# Patient Record
Sex: Male | Born: 1950 | Race: White | Hispanic: No | Marital: Single | Smoking: Never smoker
Health system: Southern US, Community
[De-identification: ages and names within clinical notes are randomized; demographics above are authoritative.]

## PROBLEM LIST (undated history)

## (undated) DIAGNOSIS — F439 Reaction to severe stress, unspecified: Secondary | ICD-10-CM

## (undated) DIAGNOSIS — E7800 Pure hypercholesterolemia, unspecified: Secondary | ICD-10-CM

## (undated) DIAGNOSIS — E88819 Insulin resistance, unspecified: Secondary | ICD-10-CM

## (undated) DIAGNOSIS — R51 Headache: Secondary | ICD-10-CM

## (undated) DIAGNOSIS — E559 Vitamin D deficiency, unspecified: Secondary | ICD-10-CM

## (undated) DIAGNOSIS — E669 Obesity, unspecified: Secondary | ICD-10-CM

## (undated) DIAGNOSIS — I10 Essential (primary) hypertension: Secondary | ICD-10-CM

## (undated) DIAGNOSIS — F32A Depression, unspecified: Secondary | ICD-10-CM

## (undated) DIAGNOSIS — F329 Major depressive disorder, single episode, unspecified: Secondary | ICD-10-CM

## (undated) DIAGNOSIS — K589 Irritable bowel syndrome without diarrhea: Secondary | ICD-10-CM

## (undated) DIAGNOSIS — E8881 Metabolic syndrome: Secondary | ICD-10-CM

## (undated) HISTORY — DX: Reaction to severe stress, unspecified: F43.9

## (undated) HISTORY — DX: Metabolic syndrome: E88.81

## (undated) HISTORY — DX: Obesity, unspecified: E66.9

## (undated) HISTORY — DX: Insulin resistance, unspecified: E88.819

## (undated) HISTORY — DX: Essential (primary) hypertension: I10

## (undated) HISTORY — DX: Pure hypercholesterolemia, unspecified: E78.00

## (undated) HISTORY — DX: Vitamin D deficiency, unspecified: E55.9

## (undated) HISTORY — DX: Irritable bowel syndrome without diarrhea: K58.9

## (undated) HISTORY — DX: Headache: R51

## (undated) HISTORY — DX: Irritable bowel syndrome, unspecified: K58.9

## (undated) HISTORY — PX: ABDOMINAL SURGERY: SHX537

---

## 2003-01-21 ENCOUNTER — Emergency Department (HOSPITAL_COMMUNITY): Admission: EM | Admit: 2003-01-21 | Discharge: 2003-01-21 | Payer: Self-pay | Admitting: Emergency Medicine

## 2003-06-27 ENCOUNTER — Emergency Department (HOSPITAL_COMMUNITY): Admission: EM | Admit: 2003-06-27 | Discharge: 2003-06-27 | Payer: Self-pay | Admitting: Emergency Medicine

## 2005-08-16 ENCOUNTER — Encounter: Admission: RE | Admit: 2005-08-16 | Discharge: 2005-08-16 | Payer: Self-pay | Admitting: Internal Medicine

## 2006-07-27 ENCOUNTER — Ambulatory Visit (HOSPITAL_COMMUNITY): Admission: RE | Admit: 2006-07-27 | Discharge: 2006-07-27 | Payer: Self-pay | Admitting: Internal Medicine

## 2006-09-02 ENCOUNTER — Emergency Department (HOSPITAL_COMMUNITY): Admission: EM | Admit: 2006-09-02 | Discharge: 2006-09-02 | Payer: Self-pay | Admitting: Family Medicine

## 2007-02-23 ENCOUNTER — Emergency Department (HOSPITAL_COMMUNITY): Admission: EM | Admit: 2007-02-23 | Discharge: 2007-02-23 | Payer: Self-pay | Admitting: Emergency Medicine

## 2007-09-17 ENCOUNTER — Emergency Department (HOSPITAL_COMMUNITY): Admission: EM | Admit: 2007-09-17 | Discharge: 2007-09-17 | Payer: Self-pay | Admitting: Family Medicine

## 2008-08-22 ENCOUNTER — Encounter: Admission: RE | Admit: 2008-08-22 | Discharge: 2008-08-22 | Payer: Self-pay | Admitting: Internal Medicine

## 2010-08-23 ENCOUNTER — Encounter: Payer: Self-pay | Admitting: Internal Medicine

## 2011-01-23 ENCOUNTER — Ambulatory Visit (HOSPITAL_COMMUNITY): Payer: 59 | Attending: Internal Medicine

## 2011-01-30 ENCOUNTER — Ambulatory Visit (HOSPITAL_COMMUNITY): Payer: 59 | Attending: Internal Medicine

## 2011-05-25 ENCOUNTER — Emergency Department (HOSPITAL_COMMUNITY)
Admission: EM | Admit: 2011-05-25 | Discharge: 2011-05-25 | Disposition: A | Discharge status: Home / Self Care | Payer: 59 | Source: Home / Self Care | Attending: Family Medicine | Admitting: Family Medicine

## 2011-05-25 ENCOUNTER — Encounter (HOSPITAL_COMMUNITY): Payer: Self-pay | Admitting: *Deleted

## 2011-05-25 DIAGNOSIS — J340 Abscess, furuncle and carbuncle of nose: Secondary | ICD-10-CM

## 2011-05-25 DIAGNOSIS — L0201 Cutaneous abscess of face: Secondary | ICD-10-CM

## 2011-05-25 HISTORY — DX: Major depressive disorder, single episode, unspecified: F32.9

## 2011-05-25 HISTORY — DX: Depression, unspecified: F32.A

## 2011-05-25 MED ORDER — DOXYCYCLINE HYCLATE 100 MG PO CAPS: 100.0000 mg | ORAL_CAPSULE | Freq: Two times a day (BID) | ORAL | Status: AC

## 2011-05-25 MED ORDER — MUPIROCIN CALCIUM 2 % NA OINT: TOPICAL_OINTMENT | NASAL | Status: AC

## 2011-05-25 NOTE — ED Notes (Signed)
Pt with c/o sinus congestion x one week using otc nasal sprays -

## 2011-05-25 NOTE — ED Provider Notes (Signed)
History     CSN: [id number]  Arrival date & time 05/25/11  1121   First MD Initiated Contact with Patient 05/25/11 1152      Chief Complaint  Patient presents with  . Nasal Congestion  . Rash    (Consider location/radiation/quality/duration/timing/severity/associated sxs/prior treatment) Patient is a 61 y.o. male presenting with rash. The history is provided by the patient.  Rash  This is a new problem. The current episode started more than 1 week ago. The problem has not changed since onset.The problem is associated with an unknown factor. There has been no fever. The rash is present on the face. The pain has been worsening since onset. Associated symptoms include pain and weeping. The treatment provided no relief.    Past Medical History  Diagnosis Date  . Essential hypertension, malignant   . Pure hypercholesterolemia   . Obesity, unspecified   . Unspecified vitamin D deficiency   . Irritable bowel syndrome   . Depression     History reviewed. No pertinent past surgical history.  History reviewed. No pertinent family history.  History  Substance Use Topics  . Smoking status: Never Smoker   . Smokeless tobacco: Not on file  . Alcohol Use: No      Review of Systems  Constitutional: Negative.   HENT: Positive for rhinorrhea.   Skin: Positive for rash.    Allergies  Review of patient's allergies indicates no known allergies.  Home Medications   Current Outpatient Rx  Name Route Sig Dispense Refill  . ATENOLOL 50 MG PO TABS Oral Take 50 mg by mouth daily.      . QUETIAPINE FUMARATE 100 MG PO TABS Oral Take 100 mg by mouth daily.      Marland Kitchen ROSUVASTATIN CALCIUM 20 MG PO TABS Oral Take 20 mg by mouth daily.      . VENLAFAXINE HCL ER 150 MG PO CP24 Oral Take 300 mg by mouth daily.     Marland Kitchen DIETHYLPROPION HCL 75 MG PO TB24 Oral Take by mouth daily.      Marland Kitchen DOXYCYCLINE HYCLATE 100 MG PO CAPS Oral Take 1 capsule (100 mg total) by mouth 2 (two) times daily. 20 capsule 0    . LORAZEPAM 1 MG PO TABS Oral Take 1 mg by mouth daily.      Marland Kitchen MUPIROCIN CALCIUM 2 % NA OINT  Apply in each nostril bid 10 g 1  . PENTAZOCINE-NALOXONE HCL 50-0.5 MG PO TABS Oral Take 1 tablet by mouth.        BP 129/88  Pulse 65  Temp(Src) 98.5 F (36.9 C) (Oral)  Resp 16  SpO2 99%  Physical Exam  Nursing note and vitals reviewed. Constitutional: He appears well-developed and well-nourished.  HENT:  Head: Normocephalic.  Right Ear: External ear normal.  Left Ear: External ear normal.  Nose: Rhinorrhea and sinus tenderness present.  Eyes: Conjunctivae and EOM are normal. Pupils are equal, round, and reactive to light.  Neck: Normal range of motion. Neck supple.    ED Course  Procedures (including critical care time)  Labs Reviewed - No data to display No results found.   1. Cellulitis of nose, external       MDM          Barkley Bruns, MD 05/25/11 1246

## 2011-07-01 LAB — LIPID PANEL
LDL Cholesterol: 66 mg/dL
Triglycerides: 176 mg/dL — AB (ref 40–160)

## 2011-11-12 LAB — BASIC METABOLIC PANEL
Creatinine: 0.8 mg/dL (ref 0.6–1.3)
Glucose: 92 mg/dL
Potassium: 4 mmol/L (ref 3.4–5.3)
Sodium: 140 mmol/L (ref 137–147)

## 2011-11-12 LAB — HEPATIC FUNCTION PANEL
ALT: 17 U/L (ref 10–40)
AST: 17 U/L (ref 14–40)
Alkaline Phosphatase: 89 U/L (ref 25–125)
Bilirubin, Total: 0.4 mg/dL

## 2012-07-07 ENCOUNTER — Encounter: Payer: Self-pay | Admitting: Hematology

## 2012-10-28 ENCOUNTER — Ambulatory Visit (HOSPITAL_COMMUNITY): Admission: RE | Admit: 2012-10-28 | Payer: 59 | Source: Ambulatory Visit | Admitting: Gastroenterology

## 2012-10-28 ENCOUNTER — Encounter (HOSPITAL_COMMUNITY): Admission: RE | Payer: Self-pay | Source: Ambulatory Visit

## 2012-10-28 SURGERY — EGD (ESOPHAGOGASTRODUODENOSCOPY)
Anesthesia: Moderate Sedation

## 2014-02-21 ENCOUNTER — Emergency Department (HOSPITAL_COMMUNITY)
Admission: EM | Admit: 2014-02-21 | Discharge: 2014-02-21 | Discharge status: Against Medical Advice | Payer: Medicare Other | Attending: Emergency Medicine | Admitting: Emergency Medicine

## 2014-02-21 ENCOUNTER — Encounter (HOSPITAL_COMMUNITY): Payer: Self-pay | Admitting: Emergency Medicine

## 2014-02-21 DIAGNOSIS — I10 Essential (primary) hypertension: Secondary | ICD-10-CM | POA: Diagnosis not present

## 2014-02-21 DIAGNOSIS — E669 Obesity, unspecified: Secondary | ICD-10-CM | POA: Diagnosis not present

## 2014-02-21 DIAGNOSIS — R1032 Left lower quadrant pain: Secondary | ICD-10-CM | POA: Diagnosis present

## 2014-02-21 NOTE — ED Notes (Signed)
Pt informed Registration that he is leaving and walked out of the ER; pt is not in lobby or immediate area outside

## 2014-02-21 NOTE — ED Notes (Signed)
Pt here sent over by Dr. August Saucerean due to left lower abd swelling, worried about incarcerated hernia. Denies n/v/d.

## 2014-02-21 NOTE — ED Notes (Signed)
Pt resting on lobby awaiting room assignment

## 2014-02-21 NOTE — ED Notes (Signed)
Pt resting in lobby awaiting room assignment

## 2014-02-22 ENCOUNTER — Emergency Department (HOSPITAL_COMMUNITY): Payer: Medicare Other

## 2014-02-22 ENCOUNTER — Inpatient Hospital Stay (HOSPITAL_COMMUNITY)
Admission: EM | Admit: 2014-02-22 | Discharge: 2014-02-27 | DRG: 872 | Disposition: A | Discharge status: Home / Self Care | Payer: Medicare Other | Attending: Internal Medicine | Admitting: Internal Medicine

## 2014-02-22 ENCOUNTER — Encounter (HOSPITAL_COMMUNITY): Payer: Self-pay | Admitting: Emergency Medicine

## 2014-02-22 DIAGNOSIS — I10 Essential (primary) hypertension: Secondary | ICD-10-CM

## 2014-02-22 DIAGNOSIS — E7800 Pure hypercholesterolemia, unspecified: Secondary | ICD-10-CM

## 2014-02-22 DIAGNOSIS — E669 Obesity, unspecified: Secondary | ICD-10-CM | POA: Diagnosis present

## 2014-02-22 DIAGNOSIS — E559 Vitamin D deficiency, unspecified: Secondary | ICD-10-CM | POA: Diagnosis present

## 2014-02-22 DIAGNOSIS — Q644 Malformation of urachus: Secondary | ICD-10-CM

## 2014-02-22 DIAGNOSIS — Z6833 Body mass index (BMI) 33.0-33.9, adult: Secondary | ICD-10-CM

## 2014-02-22 DIAGNOSIS — F439 Reaction to severe stress, unspecified: Secondary | ICD-10-CM | POA: Diagnosis present

## 2014-02-22 DIAGNOSIS — N322 Vesical fistula, not elsewhere classified: Secondary | ICD-10-CM | POA: Diagnosis present

## 2014-02-22 DIAGNOSIS — A4102 Sepsis due to Methicillin resistant Staphylococcus aureus: Principal | ICD-10-CM | POA: Diagnosis present

## 2014-02-22 DIAGNOSIS — K589 Irritable bowel syndrome without diarrhea: Secondary | ICD-10-CM | POA: Diagnosis present

## 2014-02-22 DIAGNOSIS — L0291 Cutaneous abscess, unspecified: Secondary | ICD-10-CM

## 2014-02-22 DIAGNOSIS — B9562 Methicillin resistant Staphylococcus aureus infection as the cause of diseases classified elsewhere: Secondary | ICD-10-CM | POA: Diagnosis present

## 2014-02-22 DIAGNOSIS — N179 Acute kidney failure, unspecified: Secondary | ICD-10-CM

## 2014-02-22 DIAGNOSIS — W06XXXA Fall from bed, initial encounter: Secondary | ICD-10-CM | POA: Diagnosis not present

## 2014-02-22 DIAGNOSIS — R4182 Altered mental status, unspecified: Secondary | ICD-10-CM | POA: Diagnosis not present

## 2014-02-22 DIAGNOSIS — Z79899 Other long term (current) drug therapy: Secondary | ICD-10-CM

## 2014-02-22 DIAGNOSIS — Z9889 Other specified postprocedural states: Secondary | ICD-10-CM

## 2014-02-22 DIAGNOSIS — F329 Major depressive disorder, single episode, unspecified: Secondary | ICD-10-CM | POA: Diagnosis present

## 2014-02-22 DIAGNOSIS — Z23 Encounter for immunization: Secondary | ICD-10-CM

## 2014-02-22 DIAGNOSIS — E8881 Metabolic syndrome: Secondary | ICD-10-CM | POA: Diagnosis present

## 2014-02-22 DIAGNOSIS — L02211 Cutaneous abscess of abdominal wall: Secondary | ICD-10-CM | POA: Diagnosis present

## 2014-02-22 DIAGNOSIS — R51 Headache: Secondary | ICD-10-CM | POA: Diagnosis present

## 2014-02-22 DIAGNOSIS — L03311 Cellulitis of abdominal wall: Secondary | ICD-10-CM | POA: Diagnosis present

## 2014-02-22 DIAGNOSIS — I959 Hypotension, unspecified: Secondary | ICD-10-CM | POA: Diagnosis not present

## 2014-02-22 DIAGNOSIS — R451 Restlessness and agitation: Secondary | ICD-10-CM | POA: Diagnosis not present

## 2014-02-22 DIAGNOSIS — Y92230 Patient room in hospital as the place of occurrence of the external cause: Secondary | ICD-10-CM

## 2014-02-22 LAB — COMPREHENSIVE METABOLIC PANEL
ALT: 26 U/L (ref 0–53)
ANION GAP: 20 — AB (ref 5–15)
AST: 18 U/L (ref 0–37)
Albumin: 2.9 g/dL — ABNORMAL LOW (ref 3.5–5.2)
Alkaline Phosphatase: 135 U/L — ABNORMAL HIGH (ref 39–117)
BILIRUBIN TOTAL: 0.3 mg/dL (ref 0.3–1.2)
BUN: 31 mg/dL — ABNORMAL HIGH (ref 6–23)
CO2: 26 meq/L (ref 19–32)
Calcium: 9.7 mg/dL (ref 8.4–10.5)
Chloride: 97 mEq/L (ref 96–112)
Creatinine, Ser: 1.55 mg/dL — ABNORMAL HIGH (ref 0.50–1.35)
GFR, EST AFRICAN AMERICAN: 53 mL/min — AB (ref >90)
GFR, EST NON AFRICAN AMERICAN: 45 mL/min — AB (ref >90)
GLUCOSE: 104 mg/dL — AB (ref 70–99)
Potassium: 3.6 mEq/L — ABNORMAL LOW (ref 3.7–5.3)
Sodium: 143 mEq/L (ref 137–147)
Total Protein: 7.8 g/dL (ref 6.0–8.3)

## 2014-02-22 LAB — URINALYSIS, ROUTINE W REFLEX MICROSCOPIC
Bilirubin Urine: NEGATIVE
GLUCOSE, UA: NEGATIVE mg/dL
Hgb urine dipstick: NEGATIVE
Ketones, ur: NEGATIVE mg/dL
LEUKOCYTES UA: NEGATIVE
Nitrite: NEGATIVE
PH: 5.5 (ref 5.0–8.0)
PROTEIN: NEGATIVE mg/dL
UROBILINOGEN UA: 1 mg/dL (ref 0.0–1.0)

## 2014-02-22 LAB — CBC WITH DIFFERENTIAL/PLATELET
BASOS ABS: 0 10*3/uL (ref 0.0–0.1)
Basophils Relative: 0 % (ref 0–1)
EOS PCT: 0 % (ref 0–5)
Eosinophils Absolute: 0.1 10*3/uL (ref 0.0–0.7)
HEMATOCRIT: 40.2 % (ref 39.0–52.0)
HEMOGLOBIN: 12.9 g/dL — AB (ref 13.0–17.0)
LYMPHS ABS: 1.6 10*3/uL (ref 0.7–4.0)
Lymphocytes Relative: 7 % — ABNORMAL LOW (ref 12–46)
MCH: 30.2 pg (ref 26.0–34.0)
MCHC: 32.1 g/dL (ref 30.0–36.0)
MCV: 94.1 fL (ref 78.0–100.0)
MONO ABS: 2.4 10*3/uL — AB (ref 0.1–1.0)
Monocytes Relative: 10 % (ref 3–12)
Neutro Abs: 19.6 10*3/uL — ABNORMAL HIGH (ref 1.7–7.7)
Neutrophils Relative %: 83 % — ABNORMAL HIGH (ref 43–77)
PLATELETS: 715 10*3/uL — AB (ref 150–400)
RBC: 4.27 MIL/uL (ref 4.22–5.81)
RDW: 12.6 % (ref 11.5–15.5)
WBC: 23.8 10*3/uL — AB (ref 4.0–10.5)

## 2014-02-22 LAB — LIPASE, BLOOD: LIPASE: 26 U/L (ref 11–59)

## 2014-02-22 LAB — I-STAT CG4 LACTIC ACID, ED: Lactic Acid, Venous: 3 mmol/L — ABNORMAL HIGH (ref 0.5–2.2)

## 2014-02-22 MED ORDER — IOHEXOL 300 MG/ML  SOLN
100.0000 mL | Freq: Once | INTRAMUSCULAR | Status: AC | PRN
  Administered 2014-02-22: 100 mL via INTRAVENOUS

## 2014-02-22 MED ORDER — SODIUM CHLORIDE 0.9 % IV BOLUS (SEPSIS)
1000.0000 mL | Freq: Once | INTRAVENOUS | Status: AC
  Administered 2014-02-22: 1000 mL via INTRAVENOUS

## 2014-02-22 NOTE — ED Notes (Addendum)
Pt had abdominal surgery estimated 10 years ago for colostomy insertion and repair. No surgeries since, only GI complaint IBS. Pt reports he has had abdominal swelling/masses arise over last 3 days. Pain 5/10. Denies n/v/d. Pt saw Dr August Saucerean yesterday and md was concerned about a breach either in fatty tissue or bowel.

## 2014-02-22 NOTE — ED Notes (Signed)
PA Geiple and RN Laural BenesJohnson are aware of the pts elevated Lactic Acid result of 3.0

## 2014-02-22 NOTE — ED Provider Notes (Signed)
CSN: [id number]     Arrival date & time 02/22/14  1724 History   First MD Initiated Contact with Patient 02/22/14 1818     Chief Complaint  Patient presents with  . 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 He has had some lower abdominal pain especially with movement but not at rest. He saw his primary care physician yesterday and it was recommended that he come to the hospital. He denies nausea, vomiting, diarrhea. He states that he is passing gas. PCP is concerned about problematic hernia. No treatments prior to arrival.  Patient is a 63 y.o. male presenting with abdominal pain. The history is provided by the patient.  Abdominal Pain Associated symptoms: nausea and vomiting   Associated symptoms: no chest pain, no constipation (states no BM in 1 day, + flatus), no cough, no diarrhea, no dysuria, no fever and no sore throat     Past Medical History  Diagnosis Date  . Essential hypertension, malignant   . Pure hypercholesterolemia   . Obesity, unspecified   . Unspecified vitamin D deficiency   . Irritable bowel syndrome   . Depression   . Insulin resistance     early  . Headache(784.0)   . Situational stress    Past Surgical History  Procedure Laterality Date  . Abdominal surgery     History reviewed. No pertinent family history. History  Substance Use Topics  . Smoking status: Never Smoker   . Smokeless tobacco: Not on file  . Alcohol Use: No    Review of Systems  Constitutional: Negative for fever.  HENT: Negative for rhinorrhea and sore throat.   Eyes: Negative for redness.  Respiratory: Negative for cough.   Cardiovascular: Negative for chest pain.  Gastrointestinal: Positive for nausea,  vomiting and abdominal pain. Negative for diarrhea and constipation (states no BM in 1 day, + flatus).  Genitourinary: Negative for dysuria.  Musculoskeletal: Negative for myalgias.  Skin: Negative for rash.  Neurological: Negative for headaches.   Allergies  Review of patient's allergies indicates no known allergies.  Home Medications   Prior to Admission medications   Medication Sig Start Date End Date Taking? Authorizing Provider  atenolol (TENORMIN) 50 MG tablet Take 50 mg by mouth daily.     Yes Historical Provider, MD  Diethylpropion HCl 75 MG TB24 Take 75 mg by mouth daily.    Yes Historical Provider, MD  ibuprofen (ADVIL,MOTRIN) 200 MG tablet Take 200-800 mg by mouth every 6 (six) hours as needed for fever, headache or moderate pain.   Yes Historical Provider, MD  lisinopril-hydrochlorothiazide (PRINZIDE,ZESTORETIC) 10-12.5 MG per tablet Take 1 tablet by mouth daily.   Yes Historical Provider, MD  LORazepam (ATIVAN) 1 MG tablet Take 1 mg by mouth daily.     Yes Historical Provider, MD  pentazocine-naloxone (TALWIN NX) 50-0.5 MG per tablet Take 1 tablet by mouth daily.    Yes Historical Provider, MD  QUEtiapine (SEROQUEL) 100 MG tablet Take 100 mg by mouth daily.     Yes Historical Provider, MD  rosuvastatin (CRESTOR) 20 MG tablet Take 20 mg by mouth daily.     Yes Historical Provider, MD  venlafaxine (EFFEXOR-XR) 150 MG 24 hr capsule Take 150 mg by mouth daily.    Yes Historical Provider, MD   BP [phone]  Pulse 78  Temp(Src) 98 F (36.7 C) (Oral)  Resp 16  SpO2 93%  Physical Exam  Nursing note and vitals reviewed. Constitutional: He appears well-developed and well-nourished.  HENT:  Head: Normocephalic and atraumatic.  Eyes: Conjunctivae are normal. Right eye exhibits no discharge. Left eye exhibits no discharge.  Neck: Normal range of motion. Neck supple.  Cardiovascular: Normal rate, regular rhythm and normal heart sounds.   Pulmonary/Chest: Effort normal and breath  sounds normal.  Abdominal: Soft. He exhibits distension. There is tenderness. There is no rebound and no guarding.  There are 3 discrete firm/indurated areas of lower abdomen with mild tenderness to palpation. There is mild overlying redness. Extensive post-surgical scarring.   Neurological: He is alert.  Skin: Skin is warm and dry.  Psychiatric: He has a normal mood and affect.    ED Course  Procedures (including critical care time) Labs Review Labs Reviewed  CBC WITH DIFFERENTIAL - Abnormal; Notable for the following:    WBC 23.8 (*)    Hemoglobin 12.9 (*)    Platelets 715 (*)    Neutrophils Relative % 83 (*)    Neutro Abs 19.6 (*)    Lymphocytes Relative 7 (*)    Monocytes Absolute 2.4 (*)    All other components within normal limits  COMPREHENSIVE METABOLIC PANEL - Abnormal; Notable for the following:    Potassium 3.6 (*)    Glucose, Bld 104 (*)    BUN 31 (*)    Creatinine, Ser 1.55 (*)    Albumin 2.9 (*)    Alkaline Phosphatase 135 (*)    GFR calc non Af Amer 45 (*)    GFR calc Af Amer 53 (*)    Anion gap 20 (*)    All other components within normal limits  URINALYSIS, ROUTINE W REFLEX MICROSCOPIC - Abnormal; Notable for the following:    Specific Gravity, Urine >1.046 (*)    All other components within normal limits  I-STAT CG4 LACTIC ACID, ED - Abnormal; Notable for the following:    Lactic Acid, Venous 3.00 (*)    All other components within normal limits  URINE CULTURE  LIPASE, BLOOD    Imaging Review Ct Abdomen Pelvis W Contrast  02/22/2014   CLINICAL DATA:  The abdominal pain, lower pelvic. Palpable mass in the left lower quadrant.  EXAM: CT ABDOMEN AND PELVIS WITH CONTRAST  TECHNIQUE: Multidetector CT imaging of the abdomen and pelvis was performed using the standard protocol following bolus administration of intravenous contrast.  CONTRAST:  100mL OMNIPAQUE IOHEXOL 300 MG/ML  SOLN  COMPARISON:  None.  FINDINGS: BODY WALL: Extensive scarring in the mid and  lower ventral abdomen. Reportedly the patient had abdominal surgery with colostomy formation remotely. There is also a peripherally enhancing fluid collection within the subcutaneous suprapubic compartment measuring up to 11 x 6 x 8 cm in maximal dimension. This tracks to the infraumbilical abdominal wall, and is in continuity with the apex of the urinary bladder. The apex of the urinary bladder is thickened. This is raises the possibility of a urachal sinus/mass. Although the continuity is not at the umbilicus, there has been distortion of the abdominal wall secondary to previous surgery.  LOWER CHEST: Unremarkable.  ABDOMEN/PELVIS:  Liver: 1 cm cyst in the lateral segment left liver.  Biliary: No evidence of biliary obstruction or stone.  Pancreas: Unremarkable.  Spleen: Unremarkable.  Adrenals: Unremarkable.  Kidneys and ureters: No hydronephrosis or stone.  Bladder: Thickening of the bladder apex as above.  Reproductive: Unremarkable.  Bowel: No obstruction. Normal appendix.  Retroperitoneum: No mass or adenopathy.  Peritoneum: No ascites or pneumoperitoneum.  Vascular: No acute abnormality.  OSSEOUS: No acute abnormalities.  IMPRESSION: Large abscess/fluid collection in the suprapubic subcutaneous compartment, up to 11 cm. The collection is in continuity with the thickened bladder apex, possible urachal sinus or carcinoma.   Electronically Signed   By: Tiburcio Pea M.D.   On: 02/22/2014 22:20     EKG Interpretation None      7:21 PM Patient seen and examined. Work-up initiated. Medications ordered.   Vital signs reviewed and are as follows: BP 99/62  Pulse 78  Temp(Src) 98 F (36.7 C) (Oral)  Resp 16  SpO2 93%  7:30 PM Patient seen by Dr. Anitra Lauth. Pending CT abd. ? Paniculitis.   10:54 PM CT findings as above. Spoke with Dr. Derrell Lolling of general surgery. He asks that I ask urology to given an opinion on findings.   12:41 AM Urology has seen. They plan for cystogram. Do not need surgery  involved unless there is new findings involving bowel. Urine culture ordered by urology. Will give Vanc/Zosyn to cover possible infection.   Urology asks for hospitalist admission.   12:47 AM Spoke with Dr. Welton Flakes who will see.   MDM   Final diagnoses:  Bladder fistula   Admit.    Renne Crigler, PA-C 02/23/14 [phone]

## 2014-02-23 ENCOUNTER — Encounter (HOSPITAL_COMMUNITY): Payer: Self-pay | Admitting: *Deleted

## 2014-02-23 ENCOUNTER — Inpatient Hospital Stay (HOSPITAL_COMMUNITY): Payer: Medicare Other

## 2014-02-23 ENCOUNTER — Emergency Department (HOSPITAL_COMMUNITY): Payer: Medicare Other

## 2014-02-23 DIAGNOSIS — N179 Acute kidney failure, unspecified: Secondary | ICD-10-CM | POA: Diagnosis not present

## 2014-02-23 DIAGNOSIS — K589 Irritable bowel syndrome without diarrhea: Secondary | ICD-10-CM | POA: Diagnosis not present

## 2014-02-23 DIAGNOSIS — B9562 Methicillin resistant Staphylococcus aureus infection as the cause of diseases classified elsewhere: Secondary | ICD-10-CM | POA: Diagnosis not present

## 2014-02-23 DIAGNOSIS — L0291 Cutaneous abscess, unspecified: Secondary | ICD-10-CM

## 2014-02-23 DIAGNOSIS — Z23 Encounter for immunization: Secondary | ICD-10-CM | POA: Diagnosis not present

## 2014-02-23 DIAGNOSIS — Y92230 Patient room in hospital as the place of occurrence of the external cause: Secondary | ICD-10-CM | POA: Diagnosis not present

## 2014-02-23 DIAGNOSIS — R4182 Altered mental status, unspecified: Secondary | ICD-10-CM | POA: Diagnosis not present

## 2014-02-23 DIAGNOSIS — N322 Vesical fistula, not elsewhere classified: Secondary | ICD-10-CM | POA: Diagnosis present

## 2014-02-23 DIAGNOSIS — L03311 Cellulitis of abdominal wall: Secondary | ICD-10-CM | POA: Diagnosis not present

## 2014-02-23 DIAGNOSIS — E8881 Metabolic syndrome: Secondary | ICD-10-CM | POA: Diagnosis not present

## 2014-02-23 DIAGNOSIS — E559 Vitamin D deficiency, unspecified: Secondary | ICD-10-CM | POA: Diagnosis not present

## 2014-02-23 DIAGNOSIS — Q644 Malformation of urachus: Secondary | ICD-10-CM | POA: Diagnosis not present

## 2014-02-23 DIAGNOSIS — F329 Major depressive disorder, single episode, unspecified: Secondary | ICD-10-CM | POA: Diagnosis not present

## 2014-02-23 DIAGNOSIS — I959 Hypotension, unspecified: Secondary | ICD-10-CM | POA: Diagnosis not present

## 2014-02-23 DIAGNOSIS — E669 Obesity, unspecified: Secondary | ICD-10-CM | POA: Diagnosis not present

## 2014-02-23 DIAGNOSIS — R451 Restlessness and agitation: Secondary | ICD-10-CM | POA: Diagnosis not present

## 2014-02-23 DIAGNOSIS — E7800 Pure hypercholesterolemia, unspecified: Secondary | ICD-10-CM

## 2014-02-23 DIAGNOSIS — Z6833 Body mass index (BMI) 33.0-33.9, adult: Secondary | ICD-10-CM | POA: Diagnosis not present

## 2014-02-23 DIAGNOSIS — R51 Headache: Secondary | ICD-10-CM | POA: Diagnosis not present

## 2014-02-23 DIAGNOSIS — I10 Essential (primary) hypertension: Secondary | ICD-10-CM

## 2014-02-23 DIAGNOSIS — L02211 Cutaneous abscess of abdominal wall: Secondary | ICD-10-CM | POA: Diagnosis not present

## 2014-02-23 DIAGNOSIS — Z9889 Other specified postprocedural states: Secondary | ICD-10-CM | POA: Diagnosis not present

## 2014-02-23 DIAGNOSIS — W06XXXA Fall from bed, initial encounter: Secondary | ICD-10-CM | POA: Diagnosis not present

## 2014-02-23 DIAGNOSIS — A4102 Sepsis due to Methicillin resistant Staphylococcus aureus: Secondary | ICD-10-CM | POA: Diagnosis present

## 2014-02-23 DIAGNOSIS — Z79899 Other long term (current) drug therapy: Secondary | ICD-10-CM | POA: Diagnosis not present

## 2014-02-23 DIAGNOSIS — F439 Reaction to severe stress, unspecified: Secondary | ICD-10-CM | POA: Diagnosis not present

## 2014-02-23 LAB — CBC
HCT: 35 % — ABNORMAL LOW (ref 39.0–52.0)
HEMOGLOBIN: 11.5 g/dL — AB (ref 13.0–17.0)
MCH: 30.3 pg (ref 26.0–34.0)
MCHC: 32.9 g/dL (ref 30.0–36.0)
MCV: 92.1 fL (ref 78.0–100.0)
Platelets: 539 10*3/uL — ABNORMAL HIGH (ref 150–400)
RBC: 3.8 MIL/uL — ABNORMAL LOW (ref 4.22–5.81)
RDW: 12.6 % (ref 11.5–15.5)
WBC: 19.4 10*3/uL — ABNORMAL HIGH (ref 4.0–10.5)

## 2014-02-23 LAB — COMPREHENSIVE METABOLIC PANEL
ALT: 20 U/L (ref 0–53)
ANION GAP: 16 — AB (ref 5–15)
AST: 13 U/L (ref 0–37)
Albumin: 2.6 g/dL — ABNORMAL LOW (ref 3.5–5.2)
Alkaline Phosphatase: 112 U/L (ref 39–117)
BUN: 25 mg/dL — AB (ref 6–23)
CALCIUM: 8.7 mg/dL (ref 8.4–10.5)
CO2: 25 mEq/L (ref 19–32)
CREATININE: 1 mg/dL (ref 0.50–1.35)
Chloride: 95 mEq/L — ABNORMAL LOW (ref 96–112)
GFR calc non Af Amer: 77 mL/min — ABNORMAL LOW (ref >90)
GFR, EST AFRICAN AMERICAN: 89 mL/min — AB (ref >90)
GLUCOSE: 112 mg/dL — AB (ref 70–99)
Potassium: 2.6 mEq/L — CL (ref 3.7–5.3)
Sodium: 136 mEq/L — ABNORMAL LOW (ref 137–147)
TOTAL PROTEIN: 6.8 g/dL (ref 6.0–8.3)
Total Bilirubin: 0.5 mg/dL (ref 0.3–1.2)

## 2014-02-23 LAB — GLUCOSE, CAPILLARY: GLUCOSE-CAPILLARY: 108 mg/dL — AB (ref 70–99)

## 2014-02-23 LAB — PROTIME-INR
INR: 1.32 (ref 0.00–1.49)
Prothrombin Time: 16.6 seconds — ABNORMAL HIGH (ref 11.6–15.2)

## 2014-02-23 LAB — MAGNESIUM: Magnesium: 2 mg/dL (ref 1.5–2.5)

## 2014-02-23 LAB — CREATININE, FLUID (PLEURAL, PERITONEAL, JP DRAINAGE): CREAT FL: 0.4 mg/dL

## 2014-02-23 MED ORDER — ADULT MULTIVITAMIN W/MINERALS CH
1.0000 | ORAL_TABLET | Freq: Every day | ORAL | Status: DC
  Administered 2014-02-23 – 2014-02-27 (×5): 1 via ORAL
  Filled 2014-02-23 (×5): qty 1

## 2014-02-23 MED ORDER — IOHEXOL 300 MG/ML  SOLN
200.0000 mL | Freq: Once | INTRAMUSCULAR | Status: AC | PRN
  Administered 2014-02-23: 200 mL via URETHRAL

## 2014-02-23 MED ORDER — ONDANSETRON HCL 4 MG PO TABS: 4.0000 mg | ORAL_TABLET | Freq: Four times a day (QID) | ORAL | Status: DC | PRN

## 2014-02-23 MED ORDER — ATENOLOL 50 MG PO TABS
50.0000 mg | ORAL_TABLET | Freq: Every day | ORAL | Status: DC
  Administered 2014-02-24 – 2014-02-27 (×4): 50 mg via ORAL
  Filled 2014-02-23 (×5): qty 1

## 2014-02-23 MED ORDER — HYDROMORPHONE HCL 1 MG/ML IJ SOLN: 0.5000 mg | INTRAMUSCULAR | Status: AC | PRN

## 2014-02-23 MED ORDER — SODIUM CHLORIDE 0.9 % IV SOLN
INTRAVENOUS | Status: DC
  Administered 2014-02-23 – 2014-02-26 (×4): via INTRAVENOUS

## 2014-02-23 MED ORDER — HYDROCHLOROTHIAZIDE 12.5 MG PO CAPS
12.5000 mg | ORAL_CAPSULE | Freq: Every day | ORAL | Status: DC
  Administered 2014-02-23 – 2014-02-27 (×5): 12.5 mg via ORAL
  Filled 2014-02-23 (×5): qty 1

## 2014-02-23 MED ORDER — LISINOPRIL-HYDROCHLOROTHIAZIDE 10-12.5 MG PO TABS: 1.0000 | ORAL_TABLET | Freq: Every day | ORAL | Status: DC

## 2014-02-23 MED ORDER — HEPARIN SODIUM (PORCINE) 5000 UNIT/ML IJ SOLN
5000.0000 [IU] | Freq: Three times a day (TID) | INTRAMUSCULAR | Status: DC
  Administered 2014-02-23 – 2014-02-27 (×11): 5000 [IU] via SUBCUTANEOUS
  Filled 2014-02-23 (×16): qty 1

## 2014-02-23 MED ORDER — SODIUM CHLORIDE 0.9 % IV SOLN
INTRAVENOUS | Status: AC
  Administered 2014-02-23: 11:00:00 via INTRAVENOUS

## 2014-02-23 MED ORDER — VANCOMYCIN HCL IN DEXTROSE 1-5 GM/200ML-% IV SOLN
1000.0000 mg | Freq: Once | INTRAVENOUS | Status: AC
  Administered 2014-02-23: 1000 mg via INTRAVENOUS
  Filled 2014-02-23: qty 200

## 2014-02-23 MED ORDER — ONDANSETRON HCL 4 MG/2ML IJ SOLN: 4.0000 mg | Freq: Four times a day (QID) | INTRAMUSCULAR | Status: DC | PRN

## 2014-02-23 MED ORDER — LORAZEPAM 1 MG PO TABS
1.0000 mg | ORAL_TABLET | Freq: Every day | ORAL | Status: DC
  Administered 2014-02-23 – 2014-02-27 (×5): 1 mg via ORAL
  Filled 2014-02-23 (×5): qty 1

## 2014-02-23 MED ORDER — VENLAFAXINE HCL ER 150 MG PO CP24
150.0000 mg | ORAL_CAPSULE | Freq: Every day | ORAL | Status: DC
  Administered 2014-02-23 – 2014-02-27 (×5): 150 mg via ORAL
  Filled 2014-02-23 (×5): qty 1

## 2014-02-23 MED ORDER — ZOLPIDEM TARTRATE 5 MG PO TABS: 5.0000 mg | ORAL_TABLET | Freq: Every evening | ORAL | Status: DC | PRN

## 2014-02-23 MED ORDER — ACETAMINOPHEN 325 MG PO TABS
650.0000 mg | ORAL_TABLET | Freq: Four times a day (QID) | ORAL | Status: DC | PRN
  Administered 2014-02-23: 650 mg via ORAL
  Filled 2014-02-23: qty 2

## 2014-02-23 MED ORDER — SODIUM CHLORIDE 0.9 % IV BOLUS (SEPSIS)
1000.0000 mL | Freq: Once | INTRAVENOUS | Status: DC
  Administered 2014-02-23: 1000 mL via INTRAVENOUS

## 2014-02-23 MED ORDER — DIETHYLPROPION HCL 75 MG PO TB24: 75.0000 mg | ORAL_TABLET | Freq: Every day | ORAL | Status: DC

## 2014-02-23 MED ORDER — VANCOMYCIN HCL IN DEXTROSE 1-5 GM/200ML-% IV SOLN
1000.0000 mg | Freq: Three times a day (TID) | INTRAVENOUS | Status: DC
  Administered 2014-02-23 – 2014-02-24 (×4): 1000 mg via INTRAVENOUS
  Filled 2014-02-23 (×5): qty 200

## 2014-02-23 MED ORDER — PIPERACILLIN-TAZOBACTAM 3.375 G IVPB
3.3750 g | Freq: Three times a day (TID) | INTRAVENOUS | Status: DC
  Administered 2014-02-23 – 2014-02-26 (×9): 3.375 g via INTRAVENOUS
  Filled 2014-02-23 (×11): qty 50

## 2014-02-23 MED ORDER — VITAMIN B-1 100 MG PO TABS
100.0000 mg | ORAL_TABLET | Freq: Every day | ORAL | Status: DC
  Administered 2014-02-23 – 2014-02-27 (×5): 100 mg via ORAL
  Filled 2014-02-23 (×6): qty 1

## 2014-02-23 MED ORDER — QUETIAPINE FUMARATE 100 MG PO TABS
100.0000 mg | ORAL_TABLET | Freq: Every day | ORAL | Status: DC
  Administered 2014-02-23 – 2014-02-27 (×5): 100 mg via ORAL
  Filled 2014-02-23 (×6): qty 1

## 2014-02-23 MED ORDER — SODIUM CHLORIDE 0.9 % IV BOLUS (SEPSIS)
1000.0000 mL | Freq: Once | INTRAVENOUS | Status: AC
  Administered 2014-02-23: 1000 mL via INTRAVENOUS

## 2014-02-23 MED ORDER — POTASSIUM CHLORIDE CRYS ER 20 MEQ PO TBCR
40.0000 meq | EXTENDED_RELEASE_TABLET | Freq: Two times a day (BID) | ORAL | Status: AC
  Administered 2014-02-23 (×2): 40 meq via ORAL
  Filled 2014-02-23 (×2): qty 2

## 2014-02-23 MED ORDER — LIDOCAINE HCL 1 % IJ SOLN
INTRAMUSCULAR | Status: AC
  Filled 2014-02-23: qty 20

## 2014-02-23 MED ORDER — PIPERACILLIN-TAZOBACTAM 3.375 G IVPB
3.3750 g | Freq: Once | INTRAVENOUS | Status: DC
  Administered 2014-02-23: 3.375 g via INTRAVENOUS
  Filled 2014-02-23: qty 50

## 2014-02-23 MED ORDER — VANCOMYCIN HCL IN DEXTROSE 1-5 GM/200ML-% IV SOLN: 1000.0000 mg | Freq: Once | INTRAVENOUS | Status: DC

## 2014-02-23 MED ORDER — HYDROMORPHONE HCL 1 MG/ML IJ SOLN
1.0000 mg | INTRAMUSCULAR | Status: DC | PRN
  Administered 2014-02-23: 1 mg via INTRAVENOUS
  Filled 2014-02-23: qty 1

## 2014-02-23 MED ORDER — ATENOLOL 50 MG PO TABS
50.0000 mg | ORAL_TABLET | Freq: Every day | ORAL | Status: DC
  Filled 2014-02-23: qty 1

## 2014-02-23 MED ORDER — ROSUVASTATIN CALCIUM 20 MG PO TABS
20.0000 mg | ORAL_TABLET | Freq: Every day | ORAL | Status: DC
  Administered 2014-02-23 – 2014-02-26 (×3): 20 mg via ORAL
  Filled 2014-02-23 (×5): qty 1

## 2014-02-23 MED ORDER — FOLIC ACID 1 MG PO TABS
1.0000 mg | ORAL_TABLET | Freq: Every day | ORAL | Status: DC
  Administered 2014-02-23 – 2014-02-27 (×5): 1 mg via ORAL
  Filled 2014-02-23 (×5): qty 1

## 2014-02-23 MED ORDER — LISINOPRIL 10 MG PO TABS
10.0000 mg | ORAL_TABLET | Freq: Every day | ORAL | Status: DC
  Filled 2014-02-23 (×2): qty 1

## 2014-02-23 MED ORDER — OXYCODONE HCL 5 MG PO TABS
5.0000 mg | ORAL_TABLET | ORAL | Status: DC | PRN
  Administered 2014-02-23 – 2014-02-27 (×17): 5 mg via ORAL
  Filled 2014-02-23 (×17): qty 1

## 2014-02-23 MED ORDER — PENTAZOCINE-NALOXONE HCL 50-0.5 MG PO TABS: 1.0000 | ORAL_TABLET | Freq: Every day | ORAL | Status: DC

## 2014-02-23 NOTE — Progress Notes (Signed)
Subjective: Febrile overnight. Continues to feel weak and malaise. Reported having odd reaction to dilaudid.  Objective: Vital signs in last 24 hours: Temp:  [97.8 F (36.6 C)-101.3 F (38.5 C)] 98.8 F (37.1 C) (10/14 0416) Pulse Rate:  [72-90] 75 (10/14 0720) Resp:  [16-22] 20 (10/14 0200) BP: (82-133)/(35-68) 93/48 mmHg (10/14 0720) SpO2:  [93 %-98 %] 96 % (10/14 0634) Weight:  [98.431 kg (217 lb)-99.111 kg (218 lb 8 oz)] 99.111 kg (218 lb 8 oz) (10/14 0200)  Intake/Output from previous day: 10/13 0701 - 10/14 0700 In: 250 [IV Piggyback:250] Out: 1500 [Urine:1500] Intake/Output this shift:    Physical Exam:  General: Alert and oriented CV: RRR Lungs: Clear Abdomen: Suprapubic tenderness, erythema, swelling Ext: NT, No erythema  Lab Results:  Recent Labs  02/22/14 1830 02/23/14 0311  HGB 12.9* 11.5*  HCT 40.2 35.0*   BMET  Recent Labs  02/22/14 1830 02/23/14 0311  NA 143 136*  K 3.6* 2.6*  CL 97 95*  CO2 26 25  GLUCOSE 104* 112*  BUN 31* 25*  CREATININE 1.55* 1.00  CALCIUM 9.7 8.7     Studies/Results: Ct Pelvis Wo Contrast  02/23/2014   CLINICAL DATA:  Evaluate for bladder fissure lobe. Abdominal wall abscess. Initial encounter.  EXAM: CT PELVIS WITHOUT CONTRAST  TECHNIQUE: Multidetector CT imaging of the pelvis was performed following the standard protocol without intravenous contrast.  COMPARISON:  02/22/2014 abdominal CT.  FINDINGS: 300 cc of iodinated contrast was injected through the patient's Foley catheter, with adequate distension of the urinary bladder. There is no contrast noted within the abscess or soft tissue at the bladder apex. The anterior bladder urothelium may be irregular in contour, although this could also be artifactual from mixing. There is no evidence of bowel fistula. Contrast in right lower quadrant small bowel is likely residual from previously administered oral contrast. These loops are discrete from the abscess and ventral  bladder on previous imaging.  IMPRESSION: No cystographic evidence of communication between the bladder and the bowel/suprapubic abscess.   Electronically Signed   By: Tiburcio PeaJonathan  Watts M.D.   On: 02/23/2014 01:29   Ct Abdomen Pelvis W Contrast  02/22/2014   CLINICAL DATA:  The abdominal pain, lower pelvic. Palpable mass in the left lower quadrant.  EXAM: CT ABDOMEN AND PELVIS WITH CONTRAST  TECHNIQUE: Multidetector CT imaging of the abdomen and pelvis was performed using the standard protocol following bolus administration of intravenous contrast.  CONTRAST:  100mL OMNIPAQUE IOHEXOL 300 MG/ML  SOLN  COMPARISON:  None.  FINDINGS: BODY WALL: Extensive scarring in the mid and lower ventral abdomen. Reportedly the patient had abdominal surgery with colostomy formation remotely. There is also a peripherally enhancing fluid collection within the subcutaneous suprapubic compartment measuring up to 11 x 6 x 8 cm in maximal dimension. This tracks to the infraumbilical abdominal wall, and is in continuity with the apex of the urinary bladder. The apex of the urinary bladder is thickened. This is raises the possibility of a urachal sinus/mass. Although the continuity is not at the umbilicus, there has been distortion of the abdominal wall secondary to previous surgery.  LOWER CHEST: Unremarkable.  ABDOMEN/PELVIS:  Liver: 1 cm cyst in the lateral segment left liver.  Biliary: No evidence of biliary obstruction or stone.  Pancreas: Unremarkable.  Spleen: Unremarkable.  Adrenals: Unremarkable.  Kidneys and ureters: No hydronephrosis or stone.  Bladder: Thickening of the bladder apex as above.  Reproductive: Unremarkable.  Bowel: No obstruction. Normal appendix.  Retroperitoneum:  No mass or adenopathy.  Peritoneum: No ascites or pneumoperitoneum.  Vascular: No acute abnormality.  OSSEOUS: No acute abnormalities.  IMPRESSION: Large abscess/fluid collection in the suprapubic subcutaneous compartment, up to 11 cm. The collection  is in continuity with the thickened bladder apex, possible urachal sinus or carcinoma.   Electronically Signed   By: Tiburcio PeaJonathan  Watts M.D.   On: 02/22/2014 22:20   Personally reviewed images: CT A/P w/contrast: 11 suprapubic abscess that appears to extend to and be continuous with the bladder. No evidence of intra- or extra-peritoneal bladder rupture.  CT cystogram: no evidence of communication with the bladder. It appears that the inflammation from the abscess extends up to the bladder but there is no communication and no fistula to the bowel.   Assessment/Plan: 32M with no urologic history who presented on 10/13 with a large, bothersome subcutaneous fluid collection that appeared on initial imaging to communicate with the bladder. CT cystogram was performed to further delineate this possible connection and the bladder was found to be intact with no communication with the suprapubic abscess. Patient became febrile overnight on HD#1.   1. No urologic intervention needed. CT cystogram without evidence of bladder involvement. 2. Would likely benefit from abscess drainage. Would recommend sending fluid for creatinine to confirm no urine present in the collection. Consider General Surgery consult for this.   Thank you for this consult.    LOS: 1 day   Geovannie Vilar C 02/23/2014, 8:07 AM

## 2014-02-23 NOTE — ED Notes (Signed)
Patient transported to CT 

## 2014-02-23 NOTE — Progress Notes (Signed)
ANTIBIOTIC CONSULT NOTE - INITIAL  Pharmacy Consult for Vancomycin and Zosyn  Indication: Intra-abdominal abscess  No Known Allergies  Patient Measurements: Height: 5\' 8"  (172.7 cm) Weight: 218 lb 8 oz (99.111 kg) IBW/kg (Calculated) : 68.4 Adjusted Body Weight:   Vital Signs: Temp: 98.8 F (37.1 C) (10/14 0416) Temp Source: Oral (10/14 0416) BP: 90/49 mmHg (10/14 0535) Pulse Rate: 75 (10/14 0535) Intake/Output from previous day: 10/13 0701 - 10/14 0700 In: 250 [IV Piggyback:250] Out: 1100 [Urine:1100] Intake/Output from this shift: Total I/O In: 250 [IV Piggyback:250] Out: 1100 [Urine:1100]  Labs:  Recent Labs  02/22/14 1830 02/23/14 0311  WBC 23.8* 19.4*  HGB 12.9* 11.5*  PLT 715* 539*  CREATININE 1.55* 1.00   Estimated Creatinine Clearance: 84.1 ml/min (by C-G formula based on Cr of 1). No results found for this basename: VANCOTROUGH, VANCOPEAK, VANCORANDOM, GENTTROUGH, GENTPEAK, GENTRANDOM, TOBRATROUGH, TOBRAPEAK, TOBRARND, AMIKACINPEAK, AMIKACINTROU, AMIKACIN,  in the last 72 hours   Microbiology: No results found for this or any previous visit (from the past 720 hour(s)).  Medical History: Past Medical History  Diagnosis Date  . Essential hypertension, malignant   . Pure hypercholesterolemia   . Obesity, unspecified   . Unspecified vitamin D deficiency   . Irritable bowel syndrome   . Depression   . Insulin resistance     early  . Headache(784.0)   . Situational stress     Medications:  Anti-infectives   Start     Dose/Rate Route Frequency Ordered Stop   02/23/14 0800  vancomycin (VANCOCIN) IVPB 1000 mg/200 mL premix     1,000 mg 200 mL/hr over 60 Minutes Intravenous Every 8 hours 02/23/14 0554     02/23/14 0230  vancomycin (VANCOCIN) IVPB 1000 mg/200 mL premix     1,000 mg 200 mL/hr over 60 Minutes Intravenous  Once 02/23/14 0219 02/23/14 0345   02/23/14 0230  piperacillin-tazobactam (ZOSYN) IVPB 3.375 g     3.375 g 12.5 mL/hr over 240  Minutes Intravenous 3 times per day 02/23/14 0219     02/23/14 0045  piperacillin-tazobactam (ZOSYN) IVPB 3.375 g  Status:  Discontinued     3.375 g 12.5 mL/hr over 240 Minutes Intravenous  Once 02/23/14 0035 02/23/14 0207   02/23/14 0045  vancomycin (VANCOCIN) IVPB 1000 mg/200 mL premix  Status:  Discontinued     1,000 mg 200 mL/hr over 60 Minutes Intravenous  Once 02/23/14 0042 02/23/14 0207     Assessment: Patient with intra-abdominal infection.  First dose of antibiotics already given.  Goal of Therapy:  Vancomycin trough level 15-20 mcg/ml Zosyn based on renal function   Plan:  Measure antibiotic drug levels at steady state Follow up culture results Vancomycin 1gm iv q8hr Zosyn 3.375g IV Q8H infused over 4hrs.   Darlina GuysGrimsley Jr, Jacquenette ShoneJulian Crowford 02/23/2014,5:56 AM

## 2014-02-23 NOTE — Progress Notes (Signed)
CRITICAL VALUE ALERT  Critical value received:  Potassium 2.6  Date of notification:  02/23/2014  Time of notification:  0645  Critical value read back:Yes.    Nurse who received alert:  Ophelia CharterLira Vergel de Dios RN  MD notified (1st page):  Donnamarie PoagK. Kirby NP  Time of first page:  [phone]  MD notified (2nd page): Dr. Rhona Leavenshiu  Time of second page:[phone]  Responding MD:  Dr. Rhona Leavenshiu  Time MD responded:  [phone]

## 2014-02-23 NOTE — H&P (Signed)
[facility] History and Physical  Jeffrey Pollard:[id number] DOB: 06/11/1950 DOA: 02/22/2014  Referring physician: Renne Crigler, PA PCP: Willey Blade, MD   Chief Complaint: Suprapubic swelling and cellulitis  HPI: Jeffrey Pollard is a 63 y.o. male presents with increased suprapubic swelling. Patient has multiple medical issues and prior colostomy for a perforated diverticulitis which was reversed. Patient has noted some increased swelling of the lower abdomen over the course of the last few days. Patient states that there is associated pain in the abdomen. He was thought to have a possible hernia and sent to the ED. In the ED he was seen by urology and they felt that he may have a bladder fistula. The patient has also noted that he has had a weak stream over the last day. He has no other urological symptoms noted. Patient currently has no fevers noted but has a significantly elevated WBC and also has elevated lactate and there is concern for sepsis, He looks clinically stable however. Over the suprapubic area there is significant induration and erythema noted.   Review of Systems:  12 point ROS negative except for what is noted above in HPI  Past Medical History  Diagnosis Date  . Essential hypertension, malignant   . Pure hypercholesterolemia   . Obesity, unspecified   . Unspecified vitamin D deficiency   . Irritable bowel syndrome   . Depression   . Insulin resistance     early  . Headache(784.0)   . Situational stress    Past Surgical History  Procedure Laterality Date  . Abdominal surgery     Social History:  reports that he has never smoked. He does not have any smokeless tobacco history on file. He reports that he does not drink alcohol. His drug history is not on file.  No Known Allergies  History reviewed. No pertinent family history.   Prior to Admission medications   Medication Sig Start Date End Date Taking? Authorizing Provider  atenolol (TENORMIN)  50 MG tablet Take 50 mg by mouth daily.     Yes Historical Provider, MD  Diethylpropion HCl 75 MG TB24 Take 75 mg by mouth daily.    Yes Historical Provider, MD  ibuprofen (ADVIL,MOTRIN) 200 MG tablet Take 200-800 mg by mouth every 6 (six) hours as needed for fever, headache or moderate pain.   Yes Historical Provider, MD  lisinopril-hydrochlorothiazide (PRINZIDE,ZESTORETIC) 10-12.5 MG per tablet Take 1 tablet by mouth daily.   Yes Historical Provider, MD  LORazepam (ATIVAN) 1 MG tablet Take 1 mg by mouth daily.     Yes Historical Provider, MD  pentazocine-naloxone (TALWIN NX) 50-0.5 MG per tablet Take 1 tablet by mouth daily.    Yes Historical Provider, MD  QUEtiapine (SEROQUEL) 100 MG tablet Take 100 mg by mouth daily.     Yes Historical Provider, MD  rosuvastatin (CRESTOR) 20 MG tablet Take 20 mg by mouth daily.     Yes Historical Provider, MD  venlafaxine (EFFEXOR-XR) 150 MG 24 hr capsule Take 150 mg by mouth daily.    Yes Historical Provider, MD   Physical Exam: Filed Vitals:   02/22/14 1749 02/22/14 1932 02/22/14 2139 02/23/14 0008  BP: 99/62 97/68 104/51 126/64  Pulse: 78 73 72 79  Temp: 98 F (36.7 C) 97.8 F (36.6 C) 97.9 F (36.6 C) 99 F (37.2 C)  TempSrc: Oral Oral Oral Oral  Resp: 16 22 18 20   SpO2: 93% 94% 95% 94%    Wt Readings from Last 3  Encounters:  03/23/12 99.791 kg (220 lb)  07/10/10 103.874 kg (229 lb)    General:  Appears calm and comfortable Eyes: PERRL, normal lids, irises & conjunctiva ENT: grossly normal hearing, lips & tongue Neck: no LAD, masses or thyromegaly Cardiovascular: RRR, no m/r/g. No LE edema. Respiratory: CTA bilaterally, no w/r/r. Normal respiratory effort. Abdomen: soft, ++induration suprapubic area with erythema Skin: ++rash and induration seen anterior abdomen Musculoskeletal: grossly normal tone BUE/BLE Psychiatric: grossly normal mood and affect, speech fluent and appropriate Neurologic: grossly non-focal.          Labs on  Admission:  Basic Metabolic Panel:  Recent Labs Lab 02/22/14 1830  NA 143  K 3.6*  CL 97  CO2 26  GLUCOSE 104*  BUN 31*  CREATININE 1.55*  CALCIUM 9.7   Liver Function Tests:  Recent Labs Lab 02/22/14 1830  AST 18  ALT 26  ALKPHOS 135*  BILITOT 0.3  PROT 7.8  ALBUMIN 2.9*    Recent Labs Lab 02/22/14 1830  LIPASE 26   No results found for this basename: AMMONIA,  in the last 168 hours CBC:  Recent Labs Lab 02/22/14 1830  WBC 23.8*  NEUTROABS 19.6*  HGB 12.9*  HCT 40.2  MCV 94.1  PLT 715*   Cardiac Enzymes: No results found for this basename: CKTOTAL, CKMB, CKMBINDEX, TROPONINI,  in the last 168 hours  BNP (last 3 results) No results found for this basename: PROBNP,  in the last 8760 hours CBG: No results found for this basename: GLUCAP,  in the last 168 hours  Radiological Exams on Admission: Ct Abdomen Pelvis W Contrast  02/22/2014   CLINICAL DATA:  The abdominal pain, lower pelvic. Palpable mass in the left lower quadrant.  EXAM: CT ABDOMEN AND PELVIS WITH CONTRAST  TECHNIQUE: Multidetector CT imaging of the abdomen and pelvis was performed using the standard protocol following bolus administration of intravenous contrast.  CONTRAST:  100mL OMNIPAQUE IOHEXOL 300 MG/ML  SOLN  COMPARISON:  None.  FINDINGS: BODY WALL: Extensive scarring in the mid and lower ventral abdomen. Reportedly the patient had abdominal surgery with colostomy formation remotely. There is also a peripherally enhancing fluid collection within the subcutaneous suprapubic compartment measuring up to 11 x 6 x 8 cm in maximal dimension. This tracks to the infraumbilical abdominal wall, and is in continuity with the apex of the urinary bladder. The apex of the urinary bladder is thickened. This is raises the possibility of a urachal sinus/mass. Although the continuity is not at the umbilicus, there has been distortion of the abdominal wall secondary to previous surgery.  LOWER CHEST:  Unremarkable.  ABDOMEN/PELVIS:  Liver: 1 cm cyst in the lateral segment left liver.  Biliary: No evidence of biliary obstruction or stone.  Pancreas: Unremarkable.  Spleen: Unremarkable.  Adrenals: Unremarkable.  Kidneys and ureters: No hydronephrosis or stone.  Bladder: Thickening of the bladder apex as above.  Reproductive: Unremarkable.  Bowel: No obstruction. Normal appendix.  Retroperitoneum: No mass or adenopathy.  Peritoneum: No ascites or pneumoperitoneum.  Vascular: No acute abnormality.  OSSEOUS: No acute abnormalities.  IMPRESSION: Large abscess/fluid collection in the suprapubic subcutaneous compartment, up to 11 cm. The collection is in continuity with the thickened bladder apex, possible urachal sinus or carcinoma.   Electronically Signed   By: Tiburcio PeaJonathan  Watts M.D.   On: 02/22/2014 22:20      Assessment/Plan Active Problems:   Hypertension   Hypercholesterolemia   Acute renal failure   Urachal sinus   Bladder fistula  1. Possible Urachal Sinus with possible sepsis -urology is seeing the patient -will be started on antibiotics per urology -CT scan ordered to assess  2. Acute renal failure -will monitor labs -hydrate with IVF  3. Hypercholesterolemia -will continue with home medications  4. Hypertension -will continue with home medications -monitor pressures  5. Cellulitis of the suprapubic area -will continue with antibiotics -possible drainage? awiat urological input   Code Status: Full Code (must indicate code status--if unknown or must be presumed, indicate so) DVT Prophylaxis:heparin Family Communication: None (indicate person spoken with, if applicable, with phone number if by telephone) Disposition Plan: Home (indicate anticipated LOS)  Time spent: 50min  [facility] HospitalKHAN,SAADAT A [facility] Pager [phone]

## 2014-02-23 NOTE — Consult Note (Signed)
Urology Consult   Physician requesting consult: Dr. Salley Scarlet, ED  Reason for consult: Questionable bladder fistula  History of Present Illness: Jeffrey Pollard is a 63 y.o. male with no significant prior urologic history who presents with 3 days of increasing suprapubic swelling, tenderness, and pain. He denies voiding complaints other than a weak stream over the last 24-48 hours. Specifically denies hematuria, pneumaturia, or fecaluria. No history of urinary tract infections, STDs, urolithiasis, or GU malignancy/trauma/surgery. He endorses associated malaise and reluctance to move around given discomfort in suprapubic area. He denies fevers, chills, nausea or vomiting.   He has a remote past surgical history of colostomy with subsequent takedown for perforated diverticulitis sometime in the 1990's.   Past Medical History  Diagnosis Date  . Essential hypertension, malignant   . Pure hypercholesterolemia   . Obesity, unspecified   . Unspecified vitamin D deficiency   . Irritable bowel syndrome   . Depression   . Insulin resistance     early  . Headache(784.0)   . Situational stress     Past Surgical History  Procedure Laterality Date  . Abdominal surgery      Medications:  Home meds:    Medication List    ASK your doctor about these medications       atenolol 50 MG tablet  Commonly known as:  TENORMIN  Take 50 mg by mouth daily.     CRESTOR 20 MG tablet  Generic drug:  rosuvastatin  Take 20 mg by mouth daily.     Diethylpropion HCl 75 MG Tb24  Take 75 mg by mouth daily.     ibuprofen 200 MG tablet  Commonly known as:  ADVIL,MOTRIN  Take 200-800 mg by mouth every 6 (six) hours as needed for fever, headache or moderate pain.     lisinopril-hydrochlorothiazide 10-12.5 MG per tablet  Commonly known as:  PRINZIDE,ZESTORETIC  Take 1 tablet by mouth daily.     LORazepam 1 MG tablet  Commonly known as:  ATIVAN  Take 1 mg by mouth daily.     pentazocine-naloxone  50-0.5 MG per tablet  Commonly known as:  TALWIN NX  Take 1 tablet by mouth daily.     SEROQUEL 100 MG tablet  Generic drug:  QUEtiapine  Take 100 mg by mouth daily.     venlafaxine XR 150 MG 24 hr capsule  Commonly known as:  EFFEXOR-XR  Take 150 mg by mouth daily.        Allergies: No Known Allergies  History reviewed. No pertinent family history.  Social History:  reports that he has never smoked. He does not have any smokeless tobacco history on file. He reports that he does not drink alcohol. His drug history is not on file.  ROS: A complete review of systems was performed.  All systems are negative except for pertinent findings as noted.  Physical Exam:  Vital signs in last 24 hours: Temp:  [97.8 F (36.6 C)-98 F (36.7 C)] 97.9 F (36.6 C) (10/13 2139) Pulse Rate:  [72-78] 72 (10/13 2139) Resp:  [16-22] 18 (10/13 2139) BP: (97-104)/(51-68) 104/51 mmHg (10/13 2139) SpO2:  [93 %-95 %] 95 % (10/13 2139) Constitutional:  Alert and oriented, No acute distress Cardiovascular: Regular rate and rhythm, No JVD Respiratory: Normal respiratory effort, Lungs clear bilaterally GI: Midline abdominal scar. RUQ scab. Swelling and erythema in suprapubic fat pad area. Tender to palpation. No peritoneal signs.  Genitourinary: No CVAT. Normal male phallus, testes are descended bilaterally and  non-tender and without masses, scrotum is normal in appearance without lesions or masses, perineum is normal on inspection. Rectal: Normal sphincter tone, no rectal masses, prostate is non tender and without nodularity. Prostate size is estimated to be 45 cc Lymphatic: No lymphadenopathy Neurologic: Grossly intact, no focal deficits Psychiatric: Normal mood and affect  Laboratory Data:   Recent Labs  02/22/14 1830  WBC 23.8*  HGB 12.9*  HCT 40.2  PLT 715*     Recent Labs  02/22/14 1830  NA 143  K 3.6*  CL 97  GLUCOSE 104*  BUN 31*  CALCIUM 9.7  CREATININE 1.55*    Urinalysis    Component Value Date/Time   COLORURINE YELLOW 02/22/2014 1837   APPEARANCEUR CLEAR 02/22/2014 1837   LABSPEC >1.046* 02/22/2014 1837   PHURINE 5.5 02/22/2014 1837   GLUCOSEU NEGATIVE 02/22/2014 1837   HGBUR NEGATIVE 02/22/2014 1837   BILIRUBINUR NEGATIVE 02/22/2014 1837   KETONESUR NEGATIVE 02/22/2014 1837   PROTEINUR NEGATIVE 02/22/2014 1837   UROBILINOGEN 1.0 02/22/2014 1837   NITRITE NEGATIVE 02/22/2014 1837   LEUKOCYTESUR NEGATIVE 02/22/2014 1837    Renal Function:  Recent Labs  02/22/14 1830  CREATININE 1.55*   CrCl is unknown because there is no height on file for the current visit.  Radiologic Imaging: Ct Abdomen Pelvis W Contrast  02/22/2014   CLINICAL DATA:  The abdominal pain, lower pelvic. Palpable mass in the left lower quadrant.  EXAM: CT ABDOMEN AND PELVIS WITH CONTRAST  TECHNIQUE: Multidetector CT imaging of the abdomen and pelvis was performed using the standard protocol following bolus administration of intravenous contrast.  CONTRAST:  100mL OMNIPAQUE IOHEXOL 300 MG/ML  SOLN  COMPARISON:  None.  FINDINGS: BODY WALL: Extensive scarring in the mid and lower ventral abdomen. Reportedly the patient had abdominal surgery with colostomy formation remotely. There is also a peripherally enhancing fluid collection within the subcutaneous suprapubic compartment measuring up to 11 x 6 x 8 cm in maximal dimension. This tracks to the infraumbilical abdominal wall, and is in continuity with the apex of the urinary bladder. The apex of the urinary bladder is thickened. This is raises the possibility of a urachal sinus/mass. Although the continuity is not at the umbilicus, there has been distortion of the abdominal wall secondary to previous surgery.  LOWER CHEST: Unremarkable.  ABDOMEN/PELVIS:  Liver: 1 cm cyst in the lateral segment left liver.  Biliary: No evidence of biliary obstruction or stone.  Pancreas: Unremarkable.  Spleen: Unremarkable.  Adrenals:  Unremarkable.  Kidneys and ureters: No hydronephrosis or stone.  Bladder: Thickening of the bladder apex as above.  Reproductive: Unremarkable.  Bowel: No obstruction. Normal appendix.  Retroperitoneum: No mass or adenopathy.  Peritoneum: No ascites or pneumoperitoneum.  Vascular: No acute abnormality.  OSSEOUS: No acute abnormalities.  IMPRESSION: Large abscess/fluid collection in the suprapubic subcutaneous compartment, up to 11 cm. The collection is in continuity with the thickened bladder apex, possible urachal sinus or carcinoma.   Electronically Signed   By: Tiburcio PeaJonathan  Watts M.D.   On: 02/22/2014 22:20    I independently reviewed the above imaging studies.  Impression/Recommendation: 24M with remote history of colostomy with subsequent takedown for perforated diverticulitis who presents with gradual onset of a suprapubic abdominal wall fluid collection that appears to track to the dome of the bladder and leukocytosis. It is unclear what precipitated this as he denies any traumatic event that could cause a bladder rupture and has no prior urologic history or voiding complaints to suggest the presence  of a bladder diverticulum. He denies any symptoms suggestive of an enterovesical fistula, but this is certainly possible given his prior history of perforated diverticulitis and prior bowel surgery. Urachal etiology is possible, though the fluid tract does not connect with the umbilicus.   1. Recommend CT cystogram to better delineate a possible bladder defect and communication with the anterior abdominal wall and/or possible fistula to the bowel.  2. Urine culture followed by broad spectrum antibiotics for early abscess. 3. Would recommend foley catheter drainage and possible abscess drain if the CT cystogram demonstrates that the fluid collection is in fact communicating with the bladder as initial management. Patient was made NPO after midnight in the event that he needs a drain tomorrow.   Discussed  with Dr. Marlou PorchHerrick who agrees with this plan.

## 2014-02-23 NOTE — Consult Note (Signed)
I performed a history and physical examination of the patient and discussed his management with the resident.  I reviewed the resident's note and agree with the documented findings and plan of care.  Cystogram showed no connection with the bladder. UA is normal, patient with minimal voiding symptoms.  Spoke with general surgery, they will officially consult, but we will plan to drain the collection and send the fluid for culture, creatinine and cytology.  This potentially could be of urachal origin.  Will continue to follow.

## 2014-02-23 NOTE — Progress Notes (Signed)
[facility] PROGRESS NOTE  Jeffrey HarmsSteven L Mestas ZOX:[id number] DOB: 1951-03-04 DOA: 02/22/2014 PCP: Willey BladeEAN, ERIC, MD  Assessment/Plan: 1. Possible Urachal Sinus with possible sepsis -urology was consulted, appreciate input -Remains on vanc and zosyn -CT scan without bladder involvement -Pt for IR drainage of abscess today -Follow up on cultures 2. Acute renal failure -hydrate with IVF -Cr improved  3. Hypercholesterolemia -will continue with home medications  4. Hypertension -will continue with home medications as tolerated -BP with sbp in the 100's 5. Cellulitis of the suprapubic area  -antibiotics as per above -IR drainage today -Urology following  Code Status: Full Family Communication: Pt in room  Disposition Plan: Pending   Consultants:  Urology  IR  Procedures:    Antibiotics:  Vanc 10/13>>>  Zosyn 10/13>>>   HPI/Subjective: Pt reportedly fell overnight, hypotensive,  confused and pulling out own foley cath without remembering. No other events noted.  Objective: Filed Vitals:   02/23/14 0838 02/23/14 1000 02/23/14 1054 02/23/14 1350  BP: 96/50 107/48 107/48 105/51  Pulse: 71  74 86  Temp:    99.4 F (37.4 C)  TempSrc:    Oral  Resp:    20  Height:      Weight:      SpO2:    97%    Intake/Output Summary (Last 24 hours) at 02/23/14 1704 Last data filed at 02/23/14 1525  Gross per 24 hour  Intake    700 ml  Output   2325 ml  Net  -1625 ml   Filed Weights   02/23/14 0114 02/23/14 0200  Weight: 98.431 kg (217 lb) 99.111 kg (218 lb 8 oz)    Exam:   General:  Awake, in nad  Cardiovascular: regular, s1, s2  Respiratory: normal resp effort, no wheezing  Abdomen: soft, nondistended, palpable tender fluctuance over LLQ  Musculoskeletal: perfused, no clubbing   Data Reviewed: Basic Metabolic Panel:  Recent Labs Lab 02/22/14 1830 02/23/14 0311  NA 143 136*  K 3.6* 2.6*  CL 97 95*  CO2 26 25  GLUCOSE 104* 112*  BUN 31* 25*   CREATININE 1.55* 1.00  CALCIUM 9.7 8.7  MG  --  2.0   Liver Function Tests:  Recent Labs Lab 02/22/14 1830 02/23/14 0311  AST 18 13  ALT 26 20  ALKPHOS 135* 112  BILITOT 0.3 0.5  PROT 7.8 6.8  ALBUMIN 2.9* 2.6*    Recent Labs Lab 02/22/14 1830  LIPASE 26   No results found for this basename: AMMONIA,  in the last 168 hours CBC:  Recent Labs Lab 02/22/14 1830 02/23/14 0311  WBC 23.8* 19.4*  NEUTROABS 19.6*  --   HGB 12.9* 11.5*  HCT 40.2 35.0*  MCV 94.1 92.1  PLT 715* 539*   Cardiac Enzymes: No results found for this basename: CKTOTAL, CKMB, CKMBINDEX, TROPONINI,  in the last 168 hours BNP (last 3 results) No results found for this basename: PROBNP,  in the last 8760 hours CBG:  Recent Labs Lab 02/23/14 0743  GLUCAP 108*    No results found for this or any previous visit (from the past 240 hour(s)).   Studies: Ct Pelvis Wo Contrast  02/23/2014   CLINICAL DATA:  Evaluate for bladder fissure lobe. Abdominal wall abscess. Initial encounter.  EXAM: CT PELVIS WITHOUT CONTRAST  TECHNIQUE: Multidetector CT imaging of the pelvis was performed following the standard protocol without intravenous contrast.  COMPARISON:  02/22/2014 abdominal CT.  FINDINGS: 300 cc of iodinated contrast was injected through the patient's  Foley catheter, with adequate distension of the urinary bladder. There is no contrast noted within the abscess or soft tissue at the bladder apex. The anterior bladder urothelium may be irregular in contour, although this could also be artifactual from mixing. There is no evidence of bowel fistula. Contrast in right lower quadrant small bowel is likely residual from previously administered oral contrast. These loops are discrete from the abscess and ventral bladder on previous imaging.  IMPRESSION: No cystographic evidence of communication between the bladder and the bowel/suprapubic abscess.   Electronically Signed   By: Tiburcio PeaJonathan  Watts M.D.   On: 02/23/2014  01:29   Ct Abdomen Pelvis W Contrast  02/22/2014   CLINICAL DATA:  The abdominal pain, lower pelvic. Palpable mass in the left lower quadrant.  EXAM: CT ABDOMEN AND PELVIS WITH CONTRAST  TECHNIQUE: Multidetector CT imaging of the abdomen and pelvis was performed using the standard protocol following bolus administration of intravenous contrast.  CONTRAST:  100mL OMNIPAQUE IOHEXOL 300 MG/ML  SOLN  COMPARISON:  None.  FINDINGS: BODY WALL: Extensive scarring in the mid and lower ventral abdomen. Reportedly the patient had abdominal surgery with colostomy formation remotely. There is also a peripherally enhancing fluid collection within the subcutaneous suprapubic compartment measuring up to 11 x 6 x 8 cm in maximal dimension. This tracks to the infraumbilical abdominal wall, and is in continuity with the apex of the urinary bladder. The apex of the urinary bladder is thickened. This is raises the possibility of a urachal sinus/mass. Although the continuity is not at the umbilicus, there has been distortion of the abdominal wall secondary to previous surgery.  LOWER CHEST: Unremarkable.  ABDOMEN/PELVIS:  Liver: 1 cm cyst in the lateral segment left liver.  Biliary: No evidence of biliary obstruction or stone.  Pancreas: Unremarkable.  Spleen: Unremarkable.  Adrenals: Unremarkable.  Kidneys and ureters: No hydronephrosis or stone.  Bladder: Thickening of the bladder apex as above.  Reproductive: Unremarkable.  Bowel: No obstruction. Normal appendix.  Retroperitoneum: No mass or adenopathy.  Peritoneum: No ascites or pneumoperitoneum.  Vascular: No acute abnormality.  OSSEOUS: No acute abnormalities.  IMPRESSION: Large abscess/fluid collection in the suprapubic subcutaneous compartment, up to 11 cm. The collection is in continuity with the thickened bladder apex, possible urachal sinus or carcinoma.   Electronically Signed   By: Tiburcio PeaJonathan  Watts M.D.   On: 02/22/2014 22:20    Scheduled Meds: . sodium chloride    Intravenous STAT  . atenolol  50 mg Oral Daily  . folic acid  1 mg Oral Daily  . heparin  5,000 Units Subcutaneous 3 times per day  . hydrochlorothiazide  12.5 mg Oral Daily  . lisinopril  10 mg Oral Daily  . LORazepam  1 mg Oral Daily  . multivitamin with minerals  1 tablet Oral Daily  . pentazocine-naloxone  1 tablet Oral Daily  . piperacillin-tazobactam (ZOSYN)  IV  3.375 g Intravenous 3 times per day  . potassium chloride  40 mEq Oral BID  . QUEtiapine  100 mg Oral Daily  . rosuvastatin  20 mg Oral q1800  . thiamine  100 mg Oral Daily  . vancomycin  1,000 mg Intravenous Q8H  . venlafaxine XR  150 mg Oral Daily   Continuous Infusions: . sodium chloride Stopped (02/23/14 1102)    Active Problems:   Hypertension   Hypercholesterolemia   Acute renal failure   Urachal sinus   Bladder fistula  Time spent: 35min  CHIU, STEPHEN K  [facility]  Pager [phone]. If 7PM-7AM, please contact night-coverage at [email or web address], password [facility] 02/23/2014, 5:04 PM  LOS: 1 day

## 2014-02-23 NOTE — Progress Notes (Signed)
RN paged NP secondary to pt getting OOB unassisted and falling. BP low. Pt put to bed and head lowered with feet up and 1000cc NS bolus ordered. Pt also pulled out his Foley with balloon inflated and had bleeding from the penis which had stopped by the time this NP got to room. S: Pt says he remembers some of the event. Thinks he was dreaming and possibly was sleep walking. Fell to his knees. Did not hit his head. No chest pain, dizziness, SOB, joint pain. Feels more "clear" now.  O: Appears well. VS reviewed. Alert and oriented x 3. Speech clear and appropriate. RRR. Respiratory rate normal. MOE x 4 without pain. Right patella is pink in color. No bruising, hematoma, swelling, laceration or skin break noted to extremities or head/scalp/face.  A/P: 1. Fall-no obvious injuries. Neuro checks. Likely related to pain medication. Will decrease dose of narcotic. No head injury noted. No neuro deficit. Will watch. 2. Hypotension-likely orthostatic which contributed also to #1. Bolus and recheck.  3. ? Bladder abscess-replace foley. No bleeding now-was likely due to trauma from balloon. Urology following.  Jimmye NormanKaren Kirby-Graham, NP [facility]

## 2014-02-23 NOTE — Progress Notes (Signed)
On or about 0350, I found patient sitting at the edge of the bed, a good amount of blood was all over the floor and more in the bathroom. Patient was confused and having a hard time recollecting what happened. The urine bag with the foley catheter with inflated balloon was still attached to bed and got pulled out from the patient. Patient thought he slept walked but prior to him going to sleep, he received 1 mg of Dilaudid. BP post fall was down to SBP of 80's. Maren ReamerKaren Kirby NP was made aware, gave a bolus of 1L of NS. Foley catheter was reinserted, had returned a blood tinged urine. Patient said he remembered he was down on his knees in the bathroom, knees were pink but no abrasion and has no complaints on his legs. He denied hitting the head on the floor.  Neuro check done, unremarkable. Patient is now back to his baseline, oriented x4. Last BP a little better. We will continue to monitor patient.

## 2014-02-23 NOTE — Procedures (Signed)
Interventional Radiology Procedure Note  Procedure: Placement of 58F drain into abdominal wall abscess with US guidance.  350 mL purulent fluid aspirated and cavity lavaged with sterile saline Complications: None Recommendations: - Tube to gravity drainage - Follow Cx, Cr and cytology  Signed,  Sterling BigHeath K. Salle Brandle, MD

## 2014-02-24 DIAGNOSIS — E780 Pure hypercholesterolemia: Secondary | ICD-10-CM

## 2014-02-24 LAB — RAPID URINE DRUG SCREEN, HOSP PERFORMED
Amphetamines: NOT DETECTED
BARBITURATES: POSITIVE — AB
BENZODIAZEPINES: NOT DETECTED
COCAINE: NOT DETECTED
OPIATES: POSITIVE — AB
Tetrahydrocannabinol: NOT DETECTED

## 2014-02-24 LAB — CBC
HCT: 30.7 % — ABNORMAL LOW (ref 39.0–52.0)
Hemoglobin: 10 g/dL — ABNORMAL LOW (ref 13.0–17.0)
MCH: 30.4 pg (ref 26.0–34.0)
MCHC: 32.6 g/dL (ref 30.0–36.0)
MCV: 93.3 fL (ref 78.0–100.0)
Platelets: 481 10*3/uL — ABNORMAL HIGH (ref 150–400)
RBC: 3.29 MIL/uL — ABNORMAL LOW (ref 4.22–5.81)
RDW: 12.8 % (ref 11.5–15.5)
WBC: 17.3 10*3/uL — ABNORMAL HIGH (ref 4.0–10.5)

## 2014-02-24 LAB — AMMONIA: Ammonia: 23 umol/L (ref 11–60)

## 2014-02-24 LAB — GLUCOSE, CAPILLARY
GLUCOSE-CAPILLARY: 149 mg/dL — AB (ref 70–99)
Glucose-Capillary: 99 mg/dL (ref 70–99)

## 2014-02-24 LAB — COMPREHENSIVE METABOLIC PANEL
ALT: 18 U/L (ref 0–53)
ANION GAP: 15 (ref 5–15)
AST: 27 U/L (ref 0–37)
Albumin: 2.2 g/dL — ABNORMAL LOW (ref 3.5–5.2)
Alkaline Phosphatase: 112 U/L (ref 39–117)
BILIRUBIN TOTAL: 0.4 mg/dL (ref 0.3–1.2)
BUN: 15 mg/dL (ref 6–23)
CALCIUM: 8.3 mg/dL — AB (ref 8.4–10.5)
CHLORIDE: 95 meq/L — AB (ref 96–112)
CO2: 26 meq/L (ref 19–32)
Creatinine, Ser: 0.8 mg/dL (ref 0.50–1.35)
GFR calc Af Amer: >90 mL/min (ref >90)
GFR calc non Af Amer: >90 mL/min (ref >90)
Glucose, Bld: 99 mg/dL (ref 70–99)
Potassium: 2.7 mEq/L — CL (ref 3.7–5.3)
Sodium: 136 mEq/L — ABNORMAL LOW (ref 137–147)
Total Protein: 6.2 g/dL (ref 6.0–8.3)

## 2014-02-24 LAB — VANCOMYCIN, TROUGH: Vancomycin Tr: 13.5 ug/mL (ref 10.0–20.0)

## 2014-02-24 MED ORDER — LISINOPRIL 5 MG PO TABS
5.0000 mg | ORAL_TABLET | Freq: Every day | ORAL | Status: DC
  Administered 2014-02-24 – 2014-02-27 (×4): 5 mg via ORAL
  Filled 2014-02-24 (×4): qty 1

## 2014-02-24 MED ORDER — VANCOMYCIN HCL 10 G IV SOLR
1250.0000 mg | Freq: Three times a day (TID) | INTRAVENOUS | Status: DC
  Administered 2014-02-24 – 2014-02-26 (×6): 1250 mg via INTRAVENOUS
  Filled 2014-02-24 (×6): qty 1250

## 2014-02-24 MED ORDER — POTASSIUM CHLORIDE CRYS ER 20 MEQ PO TBCR
40.0000 meq | EXTENDED_RELEASE_TABLET | ORAL | Status: AC
  Administered 2014-02-24 (×2): 40 meq via ORAL
  Filled 2014-02-24 (×2): qty 2

## 2014-02-24 NOTE — Progress Notes (Signed)
Foley removed at 0755 this am. Pt had not voided. Attempted to bladder scan but could not get a proper reading d/t abscess and possible abdomen anatomy. Pt got up to bathroom to attempt to void and was able to void 675 clear yellow urine without difficulties. Did not attempt to check a post void residual because of failed attempt prior to voiding. The amount that pt voided seems to be appropriate and it does not appear that pt is retaining urine at this time. Will continue to monitor output.   Arta BruceDeutsch, [facility] 02/24/2014 2:07 PM

## 2014-02-24 NOTE — Progress Notes (Signed)
ANTIBIOTIC CONSULT NOTE - FOLLOW UP  Pharmacy Consult for Vancomycin, Zosyn Indication: abdominal wall abscess  No Known Allergies  Patient Measurements: Height: 5\' 8"  (172.7 cm) Weight: 218 lb 8 oz (99.111 kg) IBW/kg (Calculated) : 68.4   Vital Signs: Temp: 99 F (37.2 C) (10/15 0620) Temp Source: Oral (10/15 0620) BP: 105/46 mmHg (10/15 0620) Pulse Rate: 77 (10/15 0620) Intake/Output from previous day: 10/14 0701 - 10/15 0700 In: 1520 [I.V.:820; IV Piggyback:700] Out: 2635 [Urine:2425; Drains:210] Intake/Output from this shift: Total I/O In: 5 [Other:5] Out: 150 [Urine:150]  Labs:  Recent Labs  02/22/14 1830 02/23/14 0311 02/24/14 0409  WBC 23.8* 19.4* 17.3*  HGB 12.9* 11.5* 10.0*  PLT 715* 539* 481*  CREATININE 1.55* 1.00 0.80   Estimated Creatinine Clearance: 105.1 ml/min (by C-G formula based on Cr of 0.8). No results found for this basename: VANCOTROUGH, VANCOPEAK, VANCORANDOM, GENTTROUGH, GENTPEAK, GENTRANDOM, TOBRATROUGH, TOBRAPEAK, TOBRARND, AMIKACINPEAK, AMIKACINTROU, AMIKACIN,  in the last 72 hours   Microbiology: Recent Results (from the past 720 hour(s))  CULTURE, BLOOD (ROUTINE X 2)     Status: None   Collection Time    02/23/14  2:26 AM      Result Value Ref Range Status   Specimen Description BLOOD LEFT WRIST   Final   Special Requests BOTTLES DRAWN AEROBIC ONLY 3CC   Final   Culture  Setup Time     Final   Value: 02/23/2014 08:47     Performed at [facility]   Culture     Final   Value:        BLOOD CULTURE RECEIVED NO GROWTH TO DATE CULTURE WILL BE HELD FOR 5 DAYS BEFORE ISSUING A FINAL NEGATIVE REPORT     Performed at [facility]   Report Status PENDING   Incomplete  CULTURE, BLOOD (ROUTINE X 2)     Status: None   Collection Time    02/23/14  2:31 AM      Result Value Ref Range Status   Specimen Description BLOOD LEFT ARM   Final   Special Requests BOTTLES DRAWN AEROBIC AND ANAEROBIC 10CC   Final   Culture  Setup  Time     Final   Value: 02/23/2014 08:47     Performed at [facility]   Culture     Final   Value:        BLOOD CULTURE RECEIVED NO GROWTH TO DATE CULTURE WILL BE HELD FOR 5 DAYS BEFORE ISSUING A FINAL NEGATIVE REPORT     Performed at [facility]   Report Status PENDING   Incomplete  CULTURE, ROUTINE-ABSCESS     Status: None   Collection Time    02/23/14  5:51 PM      Result Value Ref Range Status   Specimen Description ABSCESS   Final   Special Requests Normal   Final   Gram Stain     Final   Value: ABUNDANT WBC PRESENT, PREDOMINANTLY PMN     NO SQUAMOUS EPITHELIAL CELLS SEEN     FEW GRAM POSITIVE COCCI     IN PAIRS IN CLUSTERS     Performed at [facility]   Culture     Final   Value: Culture reincubated for better growth     Performed at [facility]   Report Status PENDING   Incomplete     Assessment: 63 yo male presented with suprapubic swelling/abscess and celluitis. PMH includes prior colostomy for perforated diverticulitis which  was reversed. Pt noted increased abdominal swelling over last couple of days as well. Per urology, there was question of possible bladder fistula but was found not to be the case per CT.  Pharmacy is consulted to dose vancomycin and Zosyn for abscess.  S/p suprapubic abscess drainage on 10/14.  Anti-infectives:  10/14 >> vanc >> 10/14 >> zosyn >>  Today, 10/15: D2 Vanc and Zosyn  Tmax: 99.9  WBCs: 17.3, decreased  Renal: SCr decreased to 0.8, CrCl > 100 ml/min  Blood, urine, and abscess cultures pending.  Goal of Therapy:  Vancomycin trough level 15-20 mcg/ml  Plan:   Continue Zosyn 3.375g IV Q8H infused over 4hrs.   Continue Vancomycin 1g IV q8h.  Measure Vanc trough at steady state - today at 1500.  Follow up renal fxn and culture results.   Lynann Beaverhristine Tonica Brasington PharmD, BCPS Pager [phone] 02/24/2014 10:52 AM

## 2014-02-24 NOTE — Progress Notes (Signed)
Referring Physician(s): Urology  Subjective: Patient states he has improved lower abdominal pain s/p drain placement and thinks the redness and firmness is improving.   Allergies: Review of patient's allergies indicates no known allergies.  Medications: Prior to Admission medications   Medication Sig Start Date End Date Taking? Authorizing Provider  atenolol (TENORMIN) 50 MG tablet Take 50 mg by mouth daily.     Yes Historical Provider, MD  Diethylpropion HCl 75 MG TB24 Take 75 mg by mouth daily.    Yes Historical Provider, MD  ibuprofen (ADVIL,MOTRIN) 200 MG tablet Take 200-800 mg by mouth every 6 (six) hours as needed for fever, headache or moderate pain.   Yes Historical Provider, MD  lisinopril-hydrochlorothiazide (PRINZIDE,ZESTORETIC) 10-12.5 MG per tablet Take 1 tablet by mouth daily.   Yes Historical Provider, MD  LORazepam (ATIVAN) 1 MG tablet Take 1 mg by mouth daily.     Yes Historical Provider, MD  pentazocine-naloxone (TALWIN NX) 50-0.5 MG per tablet Take 1 tablet by mouth daily.    Yes Historical Provider, MD  QUEtiapine (SEROQUEL) 100 MG tablet Take 100 mg by mouth daily.     Yes Historical Provider, MD  rosuvastatin (CRESTOR) 20 MG tablet Take 20 mg by mouth daily.     Yes Historical Provider, MD  venlafaxine (EFFEXOR-XR) 150 MG 24 hr capsule Take 150 mg by mouth daily.    Yes Historical Provider, MD    Review of Systems  Vital Signs: BP 105/46  Pulse 77  Temp(Src) 99 F (37.2 C) (Oral)  Resp 20  Ht 5\' 8"  (1.727 m)  Wt 218 lb 8 oz (99.111 kg)  BMI 33.23 kg/m2  SpO2 93%  Physical Exam General: A&Ox3, NAD Abd: Lower abdominal abscess erythematous with firm indurated areas and central area softer, perc drain intact without serous output and purulent stranding, Cx gram (+) cocci in clusters/pending, 24 hr output 210 cc   Imaging: Ct Pelvis Wo Contrast  02/23/2014   CLINICAL DATA:  Evaluate for bladder fissure lobe. Abdominal wall abscess. Initial encounter.   EXAM: CT PELVIS WITHOUT CONTRAST  TECHNIQUE: Multidetector CT imaging of the pelvis was performed following the standard protocol without intravenous contrast.  COMPARISON:  02/22/2014 abdominal CT.  FINDINGS: 300 cc of iodinated contrast was injected through the patient's Foley catheter, with adequate distension of the urinary bladder. There is no contrast noted within the abscess or soft tissue at the bladder apex. The anterior bladder urothelium may be irregular in contour, although this could also be artifactual from mixing. There is no evidence of bowel fistula. Contrast in right lower quadrant small bowel is likely residual from previously administered oral contrast. These loops are discrete from the abscess and ventral bladder on previous imaging.  IMPRESSION: No cystographic evidence of communication between the bladder and the bowel/suprapubic abscess.   Electronically Signed   By: Tiburcio PeaJonathan  Watts M.D.   On: 02/23/2014 01:29   Ct Abdomen Pelvis W Contrast  02/22/2014   CLINICAL DATA:  The abdominal pain, lower pelvic. Palpable mass in the left lower quadrant.  EXAM: CT ABDOMEN AND PELVIS WITH CONTRAST  TECHNIQUE: Multidetector CT imaging of the abdomen and pelvis was performed using the standard protocol following bolus administration of intravenous contrast.  CONTRAST:  100mL OMNIPAQUE IOHEXOL 300 MG/ML  SOLN  COMPARISON:  None.  FINDINGS: BODY WALL: Extensive scarring in the mid and lower ventral abdomen. Reportedly the patient had abdominal surgery with colostomy formation remotely. There is also a peripherally enhancing fluid collection within  the subcutaneous suprapubic compartment measuring up to 11 x 6 x 8 cm in maximal dimension. This tracks to the infraumbilical abdominal wall, and is in continuity with the apex of the urinary bladder. The apex of the urinary bladder is thickened. This is raises the possibility of a urachal sinus/mass. Although the continuity is not at the umbilicus, there has  been distortion of the abdominal wall secondary to previous surgery.  LOWER CHEST: Unremarkable.  ABDOMEN/PELVIS:  Liver: 1 cm cyst in the lateral segment left liver.  Biliary: No evidence of biliary obstruction or stone.  Pancreas: Unremarkable.  Spleen: Unremarkable.  Adrenals: Unremarkable.  Kidneys and ureters: No hydronephrosis or stone.  Bladder: Thickening of the bladder apex as above.  Reproductive: Unremarkable.  Bowel: No obstruction. Normal appendix.  Retroperitoneum: No mass or adenopathy.  Peritoneum: No ascites or pneumoperitoneum.  Vascular: No acute abnormality.  OSSEOUS: No acute abnormalities.  IMPRESSION: Large abscess/fluid collection in the suprapubic subcutaneous compartment, up to 11 cm. The collection is in continuity with the thickened bladder apex, possible urachal sinus or carcinoma.   Electronically Signed   By: Tiburcio Pea M.D.   On: 02/22/2014 22:20   Ir US Guide Bx Asp/drain  02/23/2014   CLINICAL DATA:  63 year old male with complex fluid collection in the anterior abdominal wall which appears to extend into the region of the urachus / anterior bladder. Imaging findings are concerning for urachal abscess necessitating into the anterior abdominal wall and significantly less likely locally advanced and centrally necrotic urachal malignancy. Bladder leak is also a theoretical possibility. Aspirated fluid will be sent for culture, cytology and creatinine.  EXAM: IR ULTRASOUND GUIDANCE  Date: 02/23/2014  PROCEDURE: 1. Ultrasound-guided placement of a 12 French drain into the anterior abdominal wall fluid collection. Interventional Radiologist:  Sterling Big, MD  ANESTHESIA/SEDATION: None required.  TECHNIQUE: Informed consent was obtained from the patient following explanation of the procedure, risks, benefits and alternatives. The patient understands, agrees and consents for the procedure. All questions were addressed. A time out was performed.  The suprapubic region was  interrogated with ultrasound. There is a large irregular complex fluid collection in the subcutaneous soft tissues. A suitable skin entry site was selected and marked. The region was sterilely prepped and draped in the standard fashion using Betadine skin prep. Local anesthesia was attained by infiltration with 1% lidocaine. Under real-time sonographic guidance, an 18 gauge trocar needle was advanced into the fluid collection. A 0.035 inch wire was passed through the needle and coiled within the fluid collection. The tract was then dilated to 14 [place] and a Cook 12 [place] all-purpose drainage catheter was advanced over the wire and formed within the fluid collection. Approximately 350 mL of frankly purulent material was then aspirated. Samples were sent for creatinine, cytology and culture.  The collapsed cavity was then flushed with approximately 60 cc of sterile saline and the drainage catheter connected to JP bulb suction. The catheter was secured to the skin with 0 Prolene suture and a sterile bandage was placed. The patient tolerated the procedure very well.  COMPLICATIONS: None  IMPRESSION: Technically successful placement of a 12 French drain into the suprapubic anterior abdominal wall fluid collection. Approximately 350 mL of frankly purulent material was aspirated. Samples were sent for creatinine, cytology and culture.  PLAN: 1. Maintain drain to JP bulb suction. 2. Flush with 5-10 mL sterile saline 3 times daily. 3. IR will follow drain. Signed,  Sterling Big, MD  [facility]  [facility]   Electronically Signed   By: Malachy MoanHeath  McCullough M.D.   On: 02/23/2014 18:01    Labs:  CBC:  Recent Labs  02/22/14 1830 02/23/14 0311 02/24/14 0409  WBC 23.8* 19.4* 17.3*  HGB 12.9* 11.5* 10.0*  HCT 40.2 35.0* 30.7*  PLT 715* 539* 481*    COAGS:  Recent Labs  02/23/14 1205  INR 1.32    BMP:  Recent Labs  02/22/14 1830 02/23/14 0311  02/24/14 0409  NA 143 136* 136*  K 3.6* 2.6* 2.7*  CL 97 95* 95*  CO2 26 25 26   GLUCOSE 104* 112* 99  BUN 31* 25* 15  CALCIUM 9.7 8.7 8.3*  CREATININE 1.55* 1.00 0.80  GFRNONAA 45* 77* >90  GFRAA 53* 89* >90    LIVER FUNCTION TESTS:  Recent Labs  02/22/14 1830 02/23/14 0311 02/24/14 0409  BILITOT 0.3 0.5 0.4  AST 18 13 27   ALT 26 20 18   ALKPHOS 135* 112 112  PROT 7.8 6.8 6.2  ALBUMIN 2.9* 2.6* 2.2*    Assessment and Plan: Abdominal wall abscess S/p 71F IR perc drainage catheter placed 10/14 with good output, Cx pending, fluid Cr negative confirming no urinary tract connection, wbc trending down. Continue to flush drain and monitor daily output.     I spent a total of 15 minutes face to face in clinical consultation/evaluation, greater than 50% of which was counseling/coordinating care   Signed: Berneta LevinsMORGAN, Brittish Bolinger D 02/24/2014, 12:44 PM

## 2014-02-24 NOTE — Progress Notes (Addendum)
Shift event: RN paged NP earlier in the shift secondary to pt pulling at lines and catheter and requested sitter. Sitter ordered.  RN paged back around 0335 hrs because pt restless, agitated, trying to get OOB and acting "strange". Mittens were placed on pt to prevent him from pulling out Foley catheter. NP to bedside. S: Per RN-no SOB, no pain except at procedure site, no c/o CP, no HA, no weakness, no stroke symptoms. + acting agitated at times. Medication bottles and loose pills were found in the pt's bed.  Per Pt-No chest pain, SOB, HA. Feels a little "out of it". At first denied taking his own meds but then admitted to probably taking an Ambien 10mg  and Effexor 150mg . States he lives with his sister and she stays with him "all the time". Says he doesn't drive and is retired. Denies use of illicit drugs. Says he feels like an "infant" and out of control while in the hospital because he feels we "won't let him get up or wash his hands or fix up his room." He had stated he wanted to leave but after talking, decided to stay.  O: He appears well, but strange affect. VS are stable. No increased WOB. RRR. He is alert but speech is slightly slowed, somewhat slurred but not dysarthric or aphasic. He follows all commands. MOE x 4. Face is symmetrical. Tongue midline. Strength normal. PERRLA.  A/P: 1. Strange affect-he is on Seroquel, Effexor at outpt so this NP wonders if he has a hx of psych disorder other than depression which is listed. There is nothing documented in the chart.  ? Psych consult in am. Continue sitter.  He admitted to taking his own meds and in inspection of his medication bottles, various medications were found in labeled bottles-Benadryl, Unysom OTC, Effexor, Lisinopril, Ambien, and Seroquel. In an unlabeled bottle was a 1/2 of Ambien, a whole Ambien, 3/4 of a Seroquel, and an Effexor. The partial tablets appear to be bitten off. I suspect this is the reason for his strange affect, agitation,  and confusion. He did fall last night with no obvious injuries. Neuro checks after fall were normal and there are no stroke symptoms on exam. This NP had a talk with him about our care and how we are not trying to control him, we are trying to take care of him and keep him safe. He says he understands that. Will remove the mittens for now and continue sitter. 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 Will get UDS. There are no negative physiological findings on exam. Do not think this is CO2 related-no respiratory difficulty. Do not suspect stroke. +++Also, note that pt fell last evening (10/13 shift) and pulled his Foley out with balloon inflated. See that note. His behavior at that time, speech and all, is similar to how he is tonight. Therefore, this NP questions his likely taking his own meds then as well. He agrees to stay and be cooperative. Avoid sedative meds for tonight.  Will follow.  Jimmye NormanKaren Kirby-Graham, NP [facility] +++his meds were removed from room.  Update: UDS results positive for opiates and barbituates. Pt receiving opiates here but not barbituates. Fioricet found in room earlier. Likely, pt has been taking his Fioricet here as well.

## 2014-02-24 NOTE — Progress Notes (Addendum)
Subjective: Abscess drained yesterday. Drain fluid Cr serum. Issues with taking home sleep/psych meds and agitated behavior overnight. Patient pulled out his foley and was replaced.   Objective: Vital signs in last 24 hours: Temp:  [98.6 F (37 C)-99.9 F (37.7 C)] 99 F (37.2 C) (10/15 0620) Pulse Rate:  [71-93] 77 (10/15 0620) Resp:  [18-20] 20 (10/15 0620) BP: (90-113)/(37-57) 105/46 mmHg (10/15 0620) SpO2:  [91 %-97 %] 93 % (10/15 0620)  Intake/Output from previous day: 10/14 0701 - 10/15 0700 In: 1270 [I.V.:820; IV Piggyback:450] Out: 2605 [Urine:2425; Drains:180] Intake/Output this shift: Total I/O In: -  Out: 1305 [Urine:1125; Drains:180]  Physical Exam:  General: Alert and oriented CV: RRR Lungs: Clear Abdomen: Suprapubic tenderness, erythema, swelling are improved. Drain in place.  Ext: NT, No erythema  Lab Results:  Recent Labs  02/22/14 1830 02/23/14 0311 02/24/14 0409  HGB 12.9* 11.5* 10.0*  HCT 40.2 35.0* 30.7*   BMET  Recent Labs  02/23/14 0311 02/24/14 0409  NA 136* 136*  K 2.6* 2.7*  CL 95* 95*  CO2 25 26  GLUCOSE 112* 99  BUN 25* 15  CREATININE 1.00 0.80  CALCIUM 8.7 8.3*   Drain Cr: 0.4 = serum   Studies/Results: Ct Pelvis Wo Contrast  02/23/2014   CLINICAL DATA:  Evaluate for bladder fissure lobe. Abdominal wall abscess. Initial encounter.  EXAM: CT PELVIS WITHOUT CONTRAST  TECHNIQUE: Multidetector CT imaging of the pelvis was performed following the standard protocol without intravenous contrast.  COMPARISON:  02/22/2014 abdominal CT.  FINDINGS: 300 cc of iodinated contrast was injected through the patient's Foley catheter, with adequate distension of the urinary bladder. There is no contrast noted within the abscess or soft tissue at the bladder apex. The anterior bladder urothelium may be irregular in contour, although this could also be artifactual from mixing. There is no evidence of bowel fistula. Contrast in right lower  quadrant small bowel is likely residual from previously administered oral contrast. These loops are discrete from the abscess and ventral bladder on previous imaging.  IMPRESSION: No cystographic evidence of communication between the bladder and the bowel/suprapubic abscess.   Electronically Signed   By: Tiburcio Pea M.D.   On: 02/23/2014 01:29   Ct Abdomen Pelvis W Contrast  02/22/2014   CLINICAL DATA:  The abdominal pain, lower pelvic. Palpable mass in the left lower quadrant.  EXAM: CT ABDOMEN AND PELVIS WITH CONTRAST  TECHNIQUE: Multidetector CT imaging of the abdomen and pelvis was performed using the standard protocol following bolus administration of intravenous contrast.  CONTRAST:  OMNIPAQUE IOHEXOL 300 MG/ML  SOLN  COMPARISON:  None.  FINDINGS: BODY WALL: Extensive scarring in the mid and lower ventral abdomen. Reportedly the patient had abdominal surgery with colostomy formation remotely. There is also a peripherally enhancing fluid collection within the subcutaneous suprapubic compartment measuring up to 11 x 6 x 8 cm in maximal dimension. This tracks to the infraumbilical abdominal wall, and is in continuity with the apex of the urinary bladder. The apex of the urinary bladder is thickened. This is raises the possibility of a urachal sinus/mass. Although the continuity is not at the umbilicus, there has been distortion of the abdominal wall secondary to previous surgery.  LOWER CHEST: Unremarkable.  ABDOMEN/PELVIS:  Liver: 1 cm cyst in the lateral segment left liver.  Biliary: No evidence of biliary obstruction or stone.  Pancreas: Unremarkable.  Spleen: Unremarkable.  Adrenals: Unremarkable.  Kidneys and ureters: No hydronephrosis or stone.  Bladder: Thickening  of the bladder apex as above.  Reproductive: Unremarkable.  Bowel: No obstruction. Normal appendix.  Retroperitoneum: No mass or adenopathy.  Peritoneum: No ascites or pneumoperitoneum.  Vascular: No acute abnormality.  OSSEOUS: No  acute abnormalities.  IMPRESSION: Large abscess/fluid collection in the suprapubic subcutaneous compartment, up to 11 cm. The collection is in continuity with the thickened bladder apex, possible urachal sinus or carcinoma.   Electronically Signed   By: Tiburcio PeaJonathan  Watts M.D.   On: 02/22/2014 22:20   Ir Koreas Guide Bx Asp/drain  02/23/2014   CLINICAL DATA:  63 year old male with complex fluid collection in the anterior abdominal wall which appears to extend into the region of the urachus / anterior bladder. Imaging findings are concerning for urachal abscess necessitating into the anterior abdominal wall and significantly less likely locally advanced and centrally necrotic urachal malignancy. Bladder leak is also a theoretical possibility. Aspirated fluid will be sent for culture, cytology and creatinine.  EXAM: IR ULTRASOUND GUIDANCE  Date: 02/23/2014  PROCEDURE: 1. Ultrasound-guided placement of a 12 French drain into the anterior abdominal wall fluid collection. Interventional Radiologist:  Sterling BigHeath K. McCullough, MD  ANESTHESIA/SEDATION: None required.  TECHNIQUE: Informed consent was obtained from the patient following explanation of the procedure, risks, benefits and alternatives. The patient understands, agrees and consents for the procedure. All questions were addressed. A time out was performed.  The suprapubic region was interrogated with ultrasound. There is a large irregular complex fluid collection in the subcutaneous soft tissues. A suitable skin entry site was selected and marked. The region was sterilely prepped and draped in the standard fashion using Betadine skin prep. Local anesthesia was attained by infiltration with 1% lidocaine. Under real-time sonographic guidance, an 18 gauge trocar needle was advanced into the fluid collection. A 0.035 inch wire was passed through the needle and coiled within the fluid collection. The tract was then dilated to 612 [place]French and a Cook 12 [place]French all-purpose drainage  catheter was advanced over the wire and formed within the fluid collection. Approximately 350 mL of frankly purulent material was then aspirated. Samples were sent for creatinine, cytology and culture.  The collapsed cavity was then flushed with approximately 60 cc of sterile saline and the drainage catheter connected to JP bulb suction. The catheter was secured to the skin with 0 Prolene suture and a sterile bandage was placed. The patient tolerated the procedure very well.  COMPLICATIONS: None  IMPRESSION: Technically successful placement of a 12 French drain into the suprapubic anterior abdominal wall fluid collection. Approximately 350 mL of frankly purulent material was aspirated. Samples were sent for creatinine, cytology and culture.  PLAN: 1. Maintain drain to JP bulb suction. 2. Flush with 5-10 mL sterile saline 3 times daily. 3. IR will follow drain. Signed,  Sterling BigHeath K. McCullough, MD  [facility]  [facility]   Electronically Signed   By: Malachy MoanHeath  McCullough M.D.   On: 02/23/2014 18:01   Personally reviewed images: CT A/P w/contrast: 11 suprapubic abscess that appears to extend to and be continuous with the bladder. No evidence of intra- or extra-peritoneal bladder rupture.  CT cystogram: no evidence of communication with the bladder. It appears that the inflammation from the abscess extends up to the bladder but there is no communication and no fistula to the bowel.   Assessment/Plan: 35M with no urologic history who presented on 10/13 with a large, bothersome subcutaneous fluid collection that appeared on initial imaging to communicate with the bladder. CT cystogram was  performed to further delineate this possible connection and the bladder was found to be intact with no communication with the suprapubic abscess. Patient became febrile overnight on HD#1, agitated behavior HD#2.  1. No urologic intervention needed. CT cystogram without evidence of  bladder involvement. 2. Drain fluid negative for Cr confirming no connection with urinary tract. 3. Remove foley catheter as urine is currently clear and no need for continuous bladder drainage. Bladder scan if concern patient is not emptying bladder sufficiently. Would prefer I&O catheterization as initial treatment if temporarily having difficultly voiding rather than replacing foley.  Thank you for this consult. Urology will sign off.     LOS: 2 days   JOHNSON, DAVID C 02/24/2014, 6:40 AM     Addendum: Events overnight noted. Abscess drained yesterday - fluid purulent.  Creatinine level consistent with abscess and not urine.  The area today still has a hard/endurated area left of midline.  JP output has been relatively low.  Wonder whether he needs a second drain to get loculations.  Cultures pending.  Remains on broad spectrum abx.  The drain should be left to bulb suction.  Ok to flush drain with 10cc NS q shift.  Hopeful that this collection can be managed with drainage and abx.  Once output < 30cc/day he should have a sinogram to ensure collapse of pocket.  Ultimately he made need this cavity excised - especially if it is a true infected urachal cyst. ...not sure why else this would crop up randomly. Given issues with foley and no evidence of bladder fistula, okay to remove foley.  Would bladder scan q4 hours initially given foley trauma recently.

## 2014-02-24 NOTE — Progress Notes (Signed)
CRITICAL VALUE ALERT  Critical value received:  Potassium 2.7  Date of notification:  02/24/14  Time of notification:  0529  Critical value read back:Yes.    Nurse who received alert:  Daryel NovemberJane Yannet Rincon RN  MD notified (1st page):  Donnamarie PoagK. Kirby NP Time of first page:  [phone]  MD notified (2nd page):  Time of second page:  Responding MD:  Donnamarie PoagK. Kirby NP  Time MD responded:  [phone]

## 2014-02-24 NOTE — Clinical Documentation Improvement (Signed)
Documentation reflects "possible Urachal Sinus with possible sepsis".  Please clarify if sepsis ruled in or out and document in your progress note and discharge summary.    Thank you, Doy MinceVangela Kelechi Astarita, RN [phone] Clinical Documentation Specialist

## 2014-02-24 NOTE — Clinical Documentation Improvement (Signed)
02/23/2014 progress notes "pt reportedly fell overnight, hypotensive, confused, and pulling out own foley cath without remembering".  02/24/14 progress note states "..the patient restless, agitated, trying to get OOB and acting ' strange'; suspicion of pt taking his own medications found in room and suspected 'reason for his strange affect, agitation, and confusion".  Please document in your progress note and carry over to the discharge summary if patient's agitation and confusion can be further specified.    Possible Clinical Conditions: -Toxic encephalopathy -Other encephalopathy -Other Condition -Unable to determine at present  Thank you, Doy MinceVangela Rickita Forstner, RN [phone] Clinical Documentation Specialist

## 2014-02-24 NOTE — Progress Notes (Signed)
Patient has been very restless most of the shift and has tried to get OOB unassisted several times. Patient educated on importance of calling for help from staff as he has several attachments and Hx of fall but continues to try to get OOB and pull on tubings. Order for Recruitment consultantsafety sitter received. Has pulled out three PIV's. Was observed by staff trying to self medicate with home meds. Several bottles of prescription  medications removed from room and taken to pharmacy. Patient threatened to leave if medication was taken away from him. Security  and on call provider notified and new orders received. Sitter remains at bedside.  Will continue to monitor.

## 2014-02-24 NOTE — Progress Notes (Signed)
PHARMACY BRIEF NOTE  Assessment:  Subtherapeutic Vancomycin trough in patient with abscess and goal trough of 15-7420mcg/ml.   10/15 1500 VT = 13.5 On 1g q8h (before 6th dose)   Goal of Therapy:  Vancomycin trough level 15-20 mcg/ml   Plan:  Increase Vancomycin to 1250mg  IV Q8H Measure Vanc trough at steady state Follow up renal fxn and culture results.  Loma BostonLaura Africa Masaki, PharmD Pager: [phone] 02/24/2014 3:44 PM

## 2014-02-24 NOTE — Progress Notes (Signed)
[facility] PROGRESS NOTE  RYDEN WAINER QIH:[id number] DOB: 1951-01-30 DOA: 02/22/2014 PCP: Willey Blade, MD  Assessment/Plan: 1. 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 Acute renal failure -hydrate with IVF -Cr improved  3. Hypercholesterolemia -will continue with home medications  4. Hypertension -will continue with home medications as tolerated -BP with sbp in the 100's 5. Cellulitis of the suprapubic area  -antibiotics as per above -IR drainage 10/14 6. Mental status change - See overnight events. Question if patient is taking own meds at night. Urine drug screen pos for barbituates that he is not receiving here. - Cont monitor closely - If recurrent, may consider psych consult  Code Status: Full Family Communication: Pt in room  Disposition Plan: Pending   Consultants:  Urology  IR  Procedures:  Drain of abscess 10/14  Antibiotics:  Vanc 10/13>>>  Zosyn 10/13>>>   HPI/Subjective: Pt reportedly fell overnight, hypotensive,  confused and pulling out own foley cath without remembering. No other events noted.  Objective: Filed Vitals:   02/23/14 1834 02/23/14 2201 02/24/14 0620 02/24/14 1616  BP: 113/57 90/37 105/46 109/62  Pulse: 92 78 77 73  Temp: 98.6 F (37 C)  99 F (37.2 C) 98.5 F (36.9 C)  TempSrc: Oral  Oral Oral  Resp: 18 20 20 20   Height:      Weight:      SpO2: 94% 91% 93% 94%    Intake/Output Summary (Last 24 hours) at 02/24/14 1712 Last data filed at 02/24/14 1616  Gross per 24 hour  Intake    420 ml  Output   3065 ml  Net  -2645 ml   Filed Weights   02/23/14 0114 02/23/14 0200  Weight: 98.431 kg (217 lb) 99.111 kg (218 lb 8 oz)    Exam:   General:  Awake, in nad  Cardiovascular: regular,  s1, s2  Respiratory: normal resp effort, no wheezing  Abdomen: soft, nondistended, palpable tender fluctuance over LLQ  Musculoskeletal: perfused, no clubbing   Data Reviewed: Basic Metabolic Panel:  Recent Labs Lab 02/22/14 1830 02/23/14 0311 02/24/14 0409  NA 143 136* 136*  K 3.6* 2.6* 2.7*  CL 97 95* 95*  CO2 26 25 26   GLUCOSE 104* 112* 99  BUN 31* 25* 15  CREATININE 1.55* 1.00 0.80  CALCIUM 9.7 8.7 8.3*  MG  --  2.0  --    Liver Function Tests:  Recent Labs Lab 02/22/14 1830 02/23/14 0311 02/24/14 0409  AST 18 13 27   ALT 26 20 18   ALKPHOS 135* 112 112  BILITOT 0.3 0.5 0.4  PROT 7.8 6.8 6.2  ALBUMIN 2.9* 2.6* 2.2*    Recent Labs Lab 02/22/14 1830  LIPASE 26    Recent Labs Lab 02/24/14 0610  AMMONIA 23   CBC:  Recent Labs Lab 02/22/14 1830 02/23/14 0311 02/24/14 0409  WBC 23.8* 19.4* 17.3*  NEUTROABS 19.6*  --   --   HGB 12.9* 11.5* 10.0*  HCT 40.2 35.0* 30.7*  MCV 94.1 92.1 93.3  PLT 715* 539* 481*   Cardiac Enzymes: No results found for this basename: CKTOTAL, CKMB, CKMBINDEX, TROPONINI,  in the last 168 hours BNP (last 3 results) No results found for this basename: PROBNP,  in the last 8760 hours CBG:  Recent Labs Lab 02/23/14 0743  02/24/14 0720  GLUCAP 108* 99    Recent Results (from the past 240 hour(s))  CULTURE, BLOOD (ROUTINE X 2)     Status: None   Collection Time    02/23/14  2:26 AM      Result Value Ref Range Status   Specimen Description BLOOD LEFT WRIST   Final   Special Requests BOTTLES DRAWN AEROBIC ONLY 3CC   Final   Culture  Setup Time     Final   Value: 02/23/2014 08:47     Performed at [facility]   Culture     Final   Value:        BLOOD CULTURE RECEIVED NO GROWTH TO DATE CULTURE WILL BE HELD FOR 5 DAYS BEFORE ISSUING A FINAL NEGATIVE REPORT     Performed at [facility]   Report Status PENDING   Incomplete  CULTURE, BLOOD (ROUTINE X 2)     Status: None   Collection Time    02/23/14   2:31 AM      Result Value Ref Range Status   Specimen Description BLOOD LEFT ARM   Final   Special Requests BOTTLES DRAWN AEROBIC AND ANAEROBIC 10CC   Final   Culture  Setup Time     Final   Value: 02/23/2014 08:47     Performed at [facility]   Culture     Final   Value:        BLOOD CULTURE RECEIVED NO GROWTH TO DATE CULTURE WILL BE HELD FOR 5 DAYS BEFORE ISSUING A FINAL NEGATIVE REPORT     Performed at [facility]   Report Status PENDING   Incomplete  CULTURE, ROUTINE-ABSCESS     Status: None   Collection Time    02/23/14  5:51 PM      Result Value Ref Range Status   Specimen Description ABSCESS   Final   Special Requests Normal   Final   Gram Stain     Final   Value: ABUNDANT WBC PRESENT, PREDOMINANTLY PMN     NO SQUAMOUS EPITHELIAL CELLS SEEN     FEW GRAM POSITIVE COCCI     IN PAIRS IN CLUSTERS     Performed at [facility]   Culture     Final   Value: Culture reincubated for better growth     Performed at [facility]   Report Status PENDING   Incomplete     Studies: Ct Pelvis Wo Contrast  02/23/2014   CLINICAL DATA:  Evaluate for bladder fissure lobe. Abdominal wall abscess. Initial encounter.  EXAM: CT PELVIS WITHOUT CONTRAST  TECHNIQUE: Multidetector CT imaging of the pelvis was performed following the standard protocol without intravenous contrast.  COMPARISON:  02/22/2014 abdominal CT.  FINDINGS: 300 cc of iodinated contrast was injected through the patient's Foley catheter, with adequate distension of the urinary bladder. There is no contrast noted within the abscess or soft tissue at the bladder apex. The anterior bladder urothelium may be irregular in contour, although this could also be artifactual from mixing. There is no evidence of bowel fistula. Contrast in right lower quadrant small bowel is likely residual from previously administered oral contrast. These loops are discrete from the abscess and ventral bladder on previous imaging.   IMPRESSION: No cystographic evidence of communication between the bladder and the bowel/suprapubic abscess.   Electronically Signed   By: Tiburcio PeaJonathan  Watts M.D.   On: 02/23/2014 01:29   Ct Abdomen Pelvis W Contrast  02/22/2014   CLINICAL DATA:  The abdominal pain, lower pelvic. Palpable mass in the left lower quadrant.  EXAM: CT ABDOMEN AND PELVIS WITH CONTRAST  TECHNIQUE: Multidetector CT imaging of the abdomen and pelvis was performed using the standard protocol following bolus administration of intravenous contrast.  CONTRAST:  100mL OMNIPAQUE IOHEXOL 300 MG/ML  SOLN  COMPARISON:  None.  FINDINGS: BODY WALL: Extensive scarring in the mid and lower ventral abdomen. Reportedly the patient had abdominal surgery with colostomy formation remotely. There is also a peripherally enhancing fluid collection within the subcutaneous suprapubic compartment measuring up to 11 x 6 x 8 cm in maximal dimension. This tracks to the infraumbilical abdominal wall, and is in continuity with the apex of the urinary bladder. The apex of the urinary bladder is thickened. This is raises the possibility of a urachal sinus/mass. Although the continuity is not at the umbilicus, there has been distortion of the abdominal wall secondary to previous surgery.  LOWER CHEST: Unremarkable.  ABDOMEN/PELVIS:  Liver: 1 cm cyst in the lateral segment left liver.  Biliary: No evidence of biliary obstruction or stone.  Pancreas: Unremarkable.  Spleen: Unremarkable.  Adrenals: Unremarkable.  Kidneys and ureters: No hydronephrosis or stone.  Bladder: Thickening of the bladder apex as above.  Reproductive: Unremarkable.  Bowel: No obstruction. Normal appendix.  Retroperitoneum: No mass or adenopathy.  Peritoneum: No ascites or pneumoperitoneum.  Vascular: No acute abnormality.  OSSEOUS: No acute abnormalities.  IMPRESSION: Large abscess/fluid collection in the suprapubic subcutaneous compartment, up to 11 cm. The collection is in continuity with the  thickened bladder apex, possible urachal sinus or carcinoma.   Electronically Signed   By: Tiburcio PeaJonathan  Watts M.D.   On: 02/22/2014 22:20   Ir Koreas Guide Bx Asp/drain  02/23/2014   CLINICAL DATA:  63 year old male with complex fluid collection in the anterior abdominal wall which appears to extend into the region of the urachus / anterior bladder. Imaging findings are concerning for urachal abscess necessitating into the anterior abdominal wall and significantly less likely locally advanced and centrally necrotic urachal malignancy. Bladder leak is also a theoretical possibility. Aspirated fluid will be sent for culture, cytology and creatinine.  EXAM: IR ULTRASOUND GUIDANCE  Date: 02/23/2014  PROCEDURE: 1. Ultrasound-guided placement of a 12 French drain into the anterior abdominal wall fluid collection. Interventional Radiologist:  Sterling BigHeath K. McCullough, MD  ANESTHESIA/SEDATION: None required.  TECHNIQUE: Informed consent was obtained from the patient following explanation of the procedure, risks, benefits and alternatives. The patient understands, agrees and consents for the procedure. All questions were addressed. A time out was performed.  The suprapubic region was interrogated with ultrasound. There is a large irregular complex fluid collection in the subcutaneous soft tissues. A suitable skin entry site was selected and marked. The region was sterilely prepped and draped in the standard fashion using Betadine skin prep. Local anesthesia was attained by infiltration with 1% lidocaine. Under real-time sonographic guidance, an 18 gauge trocar needle was advanced into the fluid collection. A 0.035 inch wire was passed through the needle and coiled within the fluid collection. The tract was then dilated to 812 [place]French and a Cook 12 [place]French all-purpose drainage catheter was advanced over the wire and formed within the fluid collection. Approximately 350 mL of frankly purulent material was then aspirated. Samples were  sent for creatinine, cytology and culture.  The collapsed cavity was then flushed with approximately 60 cc of sterile saline and the drainage catheter connected to JP bulb suction. The catheter  was secured to the skin with 0 Prolene suture and a sterile bandage was placed. The patient tolerated the procedure very well.  COMPLICATIONS: None  IMPRESSION: Technically successful placement of a 12 French drain into the suprapubic anterior abdominal wall fluid collection. Approximately 350 mL of frankly purulent material was aspirated. Samples were sent for creatinine, cytology and culture.  PLAN: 1. Maintain drain to JP bulb suction. 2. Flush with 5-10 mL sterile saline 3 times daily. 3. IR will follow drain. Signed,  Sterling Big, MD  [facility]  [facility]   Electronically Signed   By: Malachy Moan M.D.   On: 02/23/2014 18:01    Scheduled Meds: . atenolol  50 mg Oral Daily  . folic acid  1 mg Oral Daily  . heparin  5,000 Units Subcutaneous 3 times per day  . hydrochlorothiazide  12.5 mg Oral Daily  . lisinopril  5 mg Oral Daily  . LORazepam  1 mg Oral Daily  . multivitamin with minerals  1 tablet Oral Daily  . pentazocine-naloxone  1 tablet Oral Daily  . piperacillin-tazobactam (ZOSYN)  IV  3.375 g Intravenous 3 times per day  . QUEtiapine  100 mg Oral Daily  . rosuvastatin  20 mg Oral q1800  . thiamine  100 mg Oral Daily  . vancomycin  1,250 mg Intravenous Q8H  . venlafaxine XR  150 mg Oral Daily   Continuous Infusions: . sodium chloride Stopped (02/23/14 1102)    Active Problems:   Hypertension   Hypercholesterolemia   Acute renal failure   Urachal sinus   Bladder fistula  Time spent:  CHIU, STEPHEN K  [facility] Pager [phone]. If 7PM-7AM, please contact night-coverage at [email or web address], password [facility] 02/24/2014, 5:12 PM  LOS: 2 days

## 2014-02-25 LAB — COMPREHENSIVE METABOLIC PANEL
ALT: 17 U/L (ref 0–53)
ANION GAP: 10 (ref 5–15)
AST: 19 U/L (ref 0–37)
Albumin: 2.2 g/dL — ABNORMAL LOW (ref 3.5–5.2)
Alkaline Phosphatase: 86 U/L (ref 39–117)
BUN: 14 mg/dL (ref 6–23)
CO2: 32 meq/L (ref 19–32)
Calcium: 8.4 mg/dL (ref 8.4–10.5)
Chloride: 98 mEq/L (ref 96–112)
Creatinine, Ser: 0.86 mg/dL (ref 0.50–1.35)
GFR, EST NON AFRICAN AMERICAN: 89 mL/min — AB (ref >90)
Glucose, Bld: 116 mg/dL — ABNORMAL HIGH (ref 70–99)
Potassium: 2.9 mEq/L — CL (ref 3.7–5.3)
Sodium: 140 mEq/L (ref 137–147)
Total Bilirubin: 0.2 mg/dL — ABNORMAL LOW (ref 0.3–1.2)
Total Protein: 6.1 g/dL (ref 6.0–8.3)

## 2014-02-25 LAB — CBC
HCT: 31.9 % — ABNORMAL LOW (ref 39.0–52.0)
Hemoglobin: 10.3 g/dL — ABNORMAL LOW (ref 13.0–17.0)
MCH: 30 pg (ref 26.0–34.0)
MCHC: 32.3 g/dL (ref 30.0–36.0)
MCV: 93 fL (ref 78.0–100.0)
PLATELETS: 499 10*3/uL — AB (ref 150–400)
RBC: 3.43 MIL/uL — AB (ref 4.22–5.81)
RDW: 12.7 % (ref 11.5–15.5)
WBC: 9.9 10*3/uL (ref 4.0–10.5)

## 2014-02-25 LAB — GLUCOSE, CAPILLARY: Glucose-Capillary: 95 mg/dL (ref 70–99)

## 2014-02-25 MED ORDER — POTASSIUM CHLORIDE 10 MEQ/100ML IV SOLN
10.0000 meq | INTRAVENOUS | Status: AC
  Administered 2014-02-25 (×3): 10 meq via INTRAVENOUS
  Filled 2014-02-25 (×3): qty 100

## 2014-02-25 MED ORDER — POTASSIUM CHLORIDE CRYS ER 20 MEQ PO TBCR
30.0000 meq | EXTENDED_RELEASE_TABLET | ORAL | Status: DC
  Administered 2014-02-25: 30 meq via ORAL
  Filled 2014-02-25 (×3): qty 1

## 2014-02-25 MED ORDER — POTASSIUM CHLORIDE 10 MEQ/100ML IV SOLN: 10.0000 meq | INTRAVENOUS | Status: DC

## 2014-02-25 NOTE — Progress Notes (Signed)
[facility] PROGRESS NOTE  MACALLAN ORD WUJ:[id number] DOB: 21-Mar-1951 DOA: 02/22/2014 PCP: Willey Blade, MD  Assessment/Plan: 1. Groin abscess with sepsis -urology was consulted, appreciate input -Currently remains on vanc and zosyn -CT scan without bladder involvement -Pt s/p IR drainage of abscess 10/14 -Follow up on cultures, rare gm pos organisms thus far -Per IR, d/c drain when drain output is <10cc  -Urology to f/u in 7-10 days -Ultimately will transition abx pending culture results 2. Acute renal failure -hydrate with IVF -Cr improved  3. Hypercholesterolemia -will continue with home medications  4. Hypertension -will continue with home medications as tolerated -BP stable and controlled 5. Cellulitis of the suprapubic area  -antibiotics as per above -IR drainage 10/14 6. Mental status change - See recent events. Question if patient is taking own meds at night. Urine drug screen pos for barbituates that he is not receiving here. - Cont monitor closely - No further episodes  Code Status: Full Family Communication: Pt in room  Disposition Plan: Pending   Consultants:  Urology  IR  Procedures:  Drain of abscess 10/14  Antibiotics:  Vanc 10/13>>>  Zosyn 10/13>>>   HPI/Subjective: No acute events. Pt reports feeling better.  Objective: Filed Vitals:   02/24/14 2303 02/25/14 0604 02/25/14 1040 02/25/14 1409  BP: 124/63 118/64 125/66 117/66  Pulse: 72 68 72 66  Temp: 99.1 F (37.3 C) 98.6 F (37 C)  98.3 F (36.8 C)  TempSrc: Oral Oral  Oral  Resp: 20 20  20   Height:      Weight:      SpO2: 94% 92%  94%    Intake/Output Summary (Last 24 hours) at 02/25/14 1612 Last data filed at 02/25/14 1055  Gross per 24 hour  Intake    500 ml  Output    910 ml  Net   -410 ml   Filed Weights   02/23/14 0114 02/23/14 0200  Weight: 98.431 kg (217 lb) 99.111 kg (218 lb 8 oz)    Exam:   General:  Awake, in nad  Cardiovascular: regular, s1,  s2  Respiratory: normal resp effort, no wheezing  Abdomen: soft, nondistended, palpable tender fluctuance over LLQ  Musculoskeletal: perfused, no clubbing   Data Reviewed: Basic Metabolic Panel:  Recent Labs Lab 02/22/14 1830 02/23/14 0311 02/24/14 0409 02/25/14 0415  NA 143 136* 136* 140  K 3.6* 2.6* 2.7* 2.9*  CL 97 95* 95* 98  CO2 26 25 26  32  GLUCOSE 104* 112* 99 116*  BUN 31* 25* 15 14  CREATININE 1.55* 1.00 0.80 0.86  CALCIUM 9.7 8.7 8.3* 8.4  MG  --  2.0  --   --    Liver Function Tests:  Recent Labs Lab 02/22/14 1830 02/23/14 0311 02/24/14 0409 02/25/14 0415  AST 18 13 27 19   ALT 26 20 18 17   ALKPHOS 135* 112 112 86  BILITOT 0.3 0.5 0.4 0.2*  PROT 7.8 6.8 6.2 6.1  ALBUMIN 2.9* 2.6* 2.2* 2.2*    Recent Labs Lab 02/22/14 1830  LIPASE 26    Recent Labs Lab 02/24/14 0610  AMMONIA 23   CBC:  Recent Labs Lab 02/22/14 1830 02/23/14 0311 02/24/14 0409 02/25/14 0415  WBC 23.8* 19.4* 17.3* 9.9  NEUTROABS 19.6*  --   --   --   HGB 12.9* 11.5* 10.0* 10.3*  HCT 40.2 35.0* 30.7* 31.9*  MCV 94.1 92.1 93.3 93.0  PLT 715* 539* 481* 499*   Cardiac Enzymes: No results found for this  basename: CKTOTAL, CKMB, CKMBINDEX, TROPONINI,  in the last 168 hours BNP (last 3 results) No results found for this basename: PROBNP,  in the last 8760 hours CBG:  Recent Labs Lab 02/23/14 0743 02/24/14 0720 02/24/14 2304 02/25/14 0728  GLUCAP 108* 99 149* 95    Recent Results (from the past 240 hour(s))  CULTURE, BLOOD (ROUTINE X 2)     Status: None   Collection Time    02/23/14  2:26 AM      Result Value Ref Range Status   Specimen Description BLOOD LEFT WRIST   Final   Special Requests BOTTLES DRAWN AEROBIC ONLY 3CC   Final   Culture  Setup Time     Final   Value: 02/23/2014 08:47     Performed at [facility]   Culture     Final   Value:        BLOOD CULTURE RECEIVED NO GROWTH TO DATE CULTURE WILL BE HELD FOR 5 DAYS BEFORE ISSUING A FINAL  NEGATIVE REPORT     Performed at [facility]   Report Status PENDING   Incomplete  CULTURE, BLOOD (ROUTINE X 2)     Status: None   Collection Time    02/23/14  2:31 AM      Result Value Ref Range Status   Specimen Description BLOOD LEFT ARM   Final   Special Requests BOTTLES DRAWN AEROBIC AND ANAEROBIC 10CC   Final   Culture  Setup Time     Final   Value: 02/23/2014 08:47     Performed at [facility]   Culture     Final   Value:        BLOOD CULTURE RECEIVED NO GROWTH TO DATE CULTURE WILL BE HELD FOR 5 DAYS BEFORE ISSUING A FINAL NEGATIVE REPORT     Performed at [facility]   Report Status PENDING   Incomplete  CULTURE, ROUTINE-ABSCESS     Status: None   Collection Time    02/23/14  5:51 PM      Result Value Ref Range Status   Specimen Description ABSCESS   Final   Special Requests Normal   Final   Gram Stain     Final   Value: ABUNDANT WBC PRESENT, PREDOMINANTLY PMN     NO SQUAMOUS EPITHELIAL CELLS SEEN     FEW GRAM POSITIVE COCCI     IN PAIRS IN CLUSTERS     Performed at [facility]   Culture     Final   Value: ABUNDANT STAPHYLOCOCCUS AUREUS     Note: RIFAMPIN AND GENTAMICIN SHOULD NOT BE USED AS SINGLE DRUGS FOR TREATMENT OF STAPH INFECTIONS.     Performed at [facility]   Report Status PENDING   Incomplete     Studies: Ir Koreas Guide Bx Asp/drain  02/23/2014   CLINICAL DATA:  63 year old male with complex fluid collection in the anterior abdominal wall which appears to extend into the region of the urachus / anterior bladder. Imaging findings are concerning for urachal abscess necessitating into the anterior abdominal wall and significantly less likely locally advanced and centrally necrotic urachal malignancy. Bladder leak is also a theoretical possibility. Aspirated fluid will be sent for culture, cytology and creatinine.  EXAM: IR ULTRASOUND GUIDANCE  Date: 02/23/2014  PROCEDURE: 1. Ultrasound-guided placement of a 12 French drain  into the anterior abdominal wall fluid collection. Interventional Radiologist:  Sterling BigHeath K. McCullough, MD  ANESTHESIA/SEDATION: None required.  TECHNIQUE: Informed consent  was obtained from the patient following explanation of the procedure, risks, benefits and alternatives. The patient understands, agrees and consents for the procedure. All questions were addressed. A time out was performed.  The suprapubic region was interrogated with ultrasound. There is a large irregular complex fluid collection in the subcutaneous soft tissues. A suitable skin entry site was selected and marked. The region was sterilely prepped and draped in the standard fashion using Betadine skin prep. Local anesthesia was attained by infiltration with 1% lidocaine. Under real-time sonographic guidance, an 18 gauge trocar needle was advanced into the fluid collection. A 0.035 inch wire was passed through the needle and coiled within the fluid collection. The tract was then dilated to 4812 Jamaica[place] and a Cook 12 [place]French all-purpose drainage catheter was advanced over the wire and formed within the fluid collection. Approximately 350 mL of frankly purulent material was then aspirated. Samples were sent for creatinine, cytology and culture.  The collapsed cavity was then flushed with approximately 60 cc of sterile saline and the drainage catheter connected to JP bulb suction. The catheter was secured to the skin with 0 Prolene suture and a sterile bandage was placed. The patient tolerated the procedure very well.  COMPLICATIONS: None  IMPRESSION: Technically successful placement of a 12 French drain into the suprapubic anterior abdominal wall fluid collection. Approximately 350 mL of frankly purulent material was aspirated. Samples were sent for creatinine, cytology and culture.  PLAN: 1. Maintain drain to JP bulb suction. 2. Flush with 5-10 mL sterile saline 3 times daily. 3. IR will follow drain. Signed,  Sterling BigHeath K. McCullough, MD  [facility]  [facility]   Electronically Signed   By: Malachy MoanHeath  McCullough M.D.   On: 02/23/2014 18:01    Scheduled Meds: . atenolol  50 mg Oral Daily  . folic acid  1 mg Oral Daily  . heparin  5,000 Units Subcutaneous 3 times per day  . hydrochlorothiazide  12.5 mg Oral Daily  . lisinopril  5 mg Oral Daily  . LORazepam  1 mg Oral Daily  . multivitamin with minerals  1 tablet Oral Daily  . pentazocine-naloxone  1 tablet Oral Daily  . piperacillin-tazobactam (ZOSYN)  IV  3.375 g Intravenous 3 times per day  . QUEtiapine  100 mg Oral Daily  . rosuvastatin  20 mg Oral q1800  . thiamine  100 mg Oral Daily  . vancomycin  1,250 mg Intravenous Q8H  . venlafaxine XR  150 mg Oral Daily   Continuous Infusions: . sodium chloride 50 mL/hr at 02/25/14 0609    Active Problems:   Hypertension   Hypercholesterolemia   Acute renal failure   Urachal sinus   Bladder fistula  Time spent: 35min  Jeffrey Pollard K  [facility] Pager [phone]. If 7PM-7AM, please contact night-coverage at [email or web address], password [email or web address] 02/25/2014, 4:12 PM  LOS: 3 days

## 2014-02-25 NOTE — Progress Notes (Signed)
CRITICAL VALUE ALERT  Critical value received: K 2.9  Date of notification:  02/25/2014  Time of notification:  0507  Critical value read back:Yes.    Nurse who received alert:  Charolotte Capuchiniffany Charlayne Vultaggio  MD notified (1st page):  Donnamarie PoagK. Kirby, NP   Time of first page:  0510  MD notified (2nd page):  Time of second page:  Responding MD:  Donnamarie PoagK Kirby, NP  Time MD responded:  0600

## 2014-02-25 NOTE — Progress Notes (Signed)
Referring Physician(s): Urology  Subjective: Patient states suprapubic pain is same as yesterday, but improved overall.   Allergies: Review of patient's allergies indicates no known allergies.  Medications: Prior to Admission medications   Medication Sig Start Date End Date Taking? Authorizing Provider  atenolol (TENORMIN) 50 MG tablet Take 50 mg by mouth daily.     Yes Historical Provider, MD  Diethylpropion HCl 75 MG TB24 Take 75 mg by mouth daily.    Yes Historical Provider, MD  ibuprofen (ADVIL,MOTRIN) 200 MG tablet Take 200-800 mg by mouth every 6 (six) hours as needed for fever, headache or moderate pain.   Yes Historical Provider, MD  lisinopril-hydrochlorothiazide (PRINZIDE,ZESTORETIC) 10-12.5 MG per tablet Take 1 tablet by mouth daily.   Yes Historical Provider, MD  LORazepam (ATIVAN) 1 MG tablet Take 1 mg by mouth daily.     Yes Historical Provider, MD  pentazocine-naloxone (TALWIN NX) 50-0.5 MG per tablet Take 1 tablet by mouth daily.    Yes Historical Provider, MD  QUEtiapine (SEROQUEL) 100 MG tablet Take 100 mg by mouth daily.     Yes Historical Provider, MD  rosuvastatin (CRESTOR) 20 MG tablet Take 20 mg by mouth daily.     Yes Historical Provider, MD  venlafaxine (EFFEXOR-XR) 150 MG 24 hr capsule Take 150 mg by mouth daily.    Yes Historical Provider, MD    Review of Systems   Vital Signs: BP 125/66  Pulse 72  Temp(Src) 98.6 F (37 C) (Oral)  Resp 20  Ht 5\' 8"  (1.727 m)  Wt 218 lb 8 oz (99.111 kg)  BMI 33.23 kg/m2  SpO2 92%  Physical Exam General: A&Ox3, NAD  Abd: Lower abdominal abscess less erythematous on right side and soft and left side still with erythema and firm indurated area, perc drain intact without serous output and purulent stranding, Cx gram (+) cocci in clusters/pending, 24 hr output 60 cc, 25 cc in bulb now.   Imaging: Ct Pelvis Wo Contrast  02/23/2014   CLINICAL DATA:  Evaluate for bladder fissure lobe. Abdominal wall abscess. Initial  encounter.  EXAM: CT PELVIS WITHOUT CONTRAST  TECHNIQUE: Multidetector CT imaging of the pelvis was performed following the standard protocol without intravenous contrast.  COMPARISON:  02/22/2014 abdominal CT.  FINDINGS: 300 cc of iodinated contrast was injected through the patient's Foley catheter, with adequate distension of the urinary bladder. There is no contrast noted within the abscess or soft tissue at the bladder apex. The anterior bladder urothelium may be irregular in contour, although this could also be artifactual from mixing. There is no evidence of bowel fistula. Contrast in right lower quadrant small bowel is likely residual from previously administered oral contrast. These loops are discrete from the abscess and ventral bladder on previous imaging.  IMPRESSION: No cystographic evidence of communication between the bladder and the bowel/suprapubic abscess.   Electronically Signed   By: Tiburcio Pea M.D.   On: 02/23/2014 01:29   Ct Abdomen Pelvis W Contrast  02/22/2014   CLINICAL DATA:  The abdominal pain, lower pelvic. Palpable mass in the left lower quadrant.  EXAM: CT ABDOMEN AND PELVIS WITH CONTRAST  TECHNIQUE: Multidetector CT imaging of the abdomen and pelvis was performed using the standard protocol following bolus administration of intravenous contrast.  CONTRAST:  OMNIPAQUE IOHEXOL 300 MG/ML  SOLN  COMPARISON:  None.  FINDINGS: BODY WALL: Extensive scarring in the mid and lower ventral abdomen. Reportedly the patient had abdominal surgery with colostomy formation remotely. There is  also a peripherally enhancing fluid collection within the subcutaneous suprapubic compartment measuring up to 11 x 6 x 8 cm in maximal dimension. This tracks to the infraumbilical abdominal wall, and is in continuity with the apex of the urinary bladder. The apex of the urinary bladder is thickened. This is raises the possibility of a urachal sinus/mass. Although the continuity is not at the  umbilicus, there has been distortion of the abdominal wall secondary to previous surgery.  LOWER CHEST: Unremarkable.  ABDOMEN/PELVIS:  Liver: 1 cm cyst in the lateral segment left liver.  Biliary: No evidence of biliary obstruction or stone.  Pancreas: Unremarkable.  Spleen: Unremarkable.  Adrenals: Unremarkable.  Kidneys and ureters: No hydronephrosis or stone.  Bladder: Thickening of the bladder apex as above.  Reproductive: Unremarkable.  Bowel: No obstruction. Normal appendix.  Retroperitoneum: No mass or adenopathy.  Peritoneum: No ascites or pneumoperitoneum.  Vascular: No acute abnormality.  OSSEOUS: No acute abnormalities.  IMPRESSION: Large abscess/fluid collection in the suprapubic subcutaneous compartment, up to 11 cm. The collection is in continuity with the thickened bladder apex, possible urachal sinus or carcinoma.   Electronically Signed   By: Tiburcio PeaJonathan  Watts M.D.   On: 02/22/2014 22:20   Ir Koreas Guide Bx Asp/drain  02/23/2014   CLINICAL DATA:  63 year old male with complex fluid collection in the anterior abdominal wall which appears to extend into the region of the urachus / anterior bladder. Imaging findings are concerning for urachal abscess necessitating into the anterior abdominal wall and significantly less likely locally advanced and centrally necrotic urachal malignancy. Bladder leak is also a theoretical possibility. Aspirated fluid will be sent for culture, cytology and creatinine.  EXAM: IR ULTRASOUND GUIDANCE  Date: 02/23/2014  PROCEDURE: 1. Ultrasound-guided placement of a 12 French drain into the anterior abdominal wall fluid collection. Interventional Radiologist:  Sterling BigHeath K. McCullough, MD  ANESTHESIA/SEDATION: None required.  TECHNIQUE: Informed consent was obtained from the patient following explanation of the procedure, risks, benefits and alternatives. The patient understands, agrees and consents for the procedure. All questions were addressed. A time out was performed.  The  suprapubic region was interrogated with ultrasound. There is a large irregular complex fluid collection in the subcutaneous soft tissues. A suitable skin entry site was selected and marked. The region was sterilely prepped and draped in the standard fashion using Betadine skin prep. Local anesthesia was attained by infiltration with 1% lidocaine. Under real-time sonographic guidance, an 18 gauge trocar needle was advanced into the fluid collection. A 0.035 inch wire was passed through the needle and coiled within the fluid collection. The tract was then dilated to 4812 [place] and a Cook 12 [place] all-purpose drainage catheter was advanced over the wire and formed within the fluid collection. Approximately 350 mL of frankly purulent material was then aspirated. Samples were sent for creatinine, cytology and culture.  The collapsed cavity was then flushed with approximately 60 cc of sterile saline and the drainage catheter connected to JP bulb suction. The catheter was secured to the skin with 0 Prolene suture and a sterile bandage was placed. The patient tolerated the procedure very well.  COMPLICATIONS: None  IMPRESSION: Technically successful placement of a 12 French drain into the suprapubic anterior abdominal wall fluid collection. Approximately 350 mL of frankly purulent material was aspirated. Samples were sent for creatinine, cytology and culture.  PLAN: 1. Maintain drain to JP bulb suction. 2. Flush with 5-10 mL sterile saline 3 times daily. 3. IR will follow drain. Signed,  Vilma PraderHeath  K. Archer AsaMcCullough, MD  [facility]  [facility]   Electronically Signed   By: Malachy MoanHeath  McCullough M.D.   On: 02/23/2014 18:01    Labs:  CBC:  Recent Labs  02/22/14 1830 02/23/14 0311 02/24/14 0409 02/25/14 0415  WBC 23.8* 19.4* 17.3* 9.9  HGB 12.9* 11.5* 10.0* 10.3*  HCT 40.2 35.0* 30.7* 31.9*  PLT 715* 539* 481* 499*    COAGS:  Recent Labs  02/23/14 1205  INR 1.32     BMP:  Recent Labs  02/22/14 1830 02/23/14 0311 02/24/14 0409 02/25/14 0415  NA 143 136* 136* 140  K 3.6* 2.6* 2.7* 2.9*  CL 97 95* 95* 98  CO2 26 25 26  32  GLUCOSE 104* 112* 99 116*  BUN 31* 25* 15 14  CALCIUM 9.7 8.7 8.3* 8.4  CREATININE 1.55* 1.00 0.80 0.86  GFRNONAA 45* 77* >90 89*  GFRAA 53* 89* >90 >90    LIVER FUNCTION TESTS:  Recent Labs  02/22/14 1830 02/23/14 0311 02/24/14 0409 02/25/14 0415  BILITOT 0.3 0.5 0.4 0.2*  AST 18 13 27 19   ALT 26 20 18 17   ALKPHOS 135* 112 112 86  PROT 7.8 6.8 6.2 6.1  ALBUMIN 2.9* 2.6* 2.2* 2.2*    Assessment and Plan: Abdominal/suprapubic wall abscess  S/p 20F IR perc drainage catheter placed 10/14 with good output, Cx pending, fluid Cr negative confirming no urinary tract connection, wbc trending down and wnl now. Continue to flush drain and monitor daily output.  Drain to stay in place until minimal output < 10cc, if discharged will F/U with urology in 7-10 days per Urology note    I spent a total of 15 minutes face to face in clinical consultation/evaluation, greater than 50% of which was counseling/coordinating care   Signed: Berneta LevinsMORGAN, Amine Adelson D 02/25/2014, 11:38 AM

## 2014-02-25 NOTE — Progress Notes (Signed)
Subjective: No acute events Foley removed and patient voiding - albeit slower than normal Afebrile Pain improving  Objective: Vital signs in last 24 hours: Temp:  [98.5 F (36.9 C)-99.1 F (37.3 C)] 98.6 F (37 C) (10/16 0604) Pulse Rate:  [68-73] 68 (10/16 0604) Resp:  [20] 20 (10/16 0604) BP: (109-124)/(62-64) 118/64 mmHg (10/16 0604) SpO2:  [92 %-94 %] 92 % (10/16 0604)  Intake/Output from previous day: 10/15 0701 - 10/16 0700 In: 310 [IV Piggyback:300] Out: 1735 [Urine:1675; Drains:60] Intake/Output this shift:    Physical Exam:  General: Alert and oriented CV: RRR Lungs: Clear Abdomen: Suprapubic tenderness, erythema improved, left of midline he still has a hard tender area, drain in place Ext: NT, No erythema  Lab Results:  Recent Labs  02/23/14 0311 02/24/14 0409 02/25/14 0415  HGB 11.5* 10.0* 10.3*  HCT 35.0* 30.7* 31.9*   BMET  Recent Labs  02/24/14 0409 02/25/14 0415  NA 136* 140  K 2.7* 2.9*  CL 95* 98  CO2 26 32  GLUCOSE 99 116*  BUN 15 14  CREATININE 0.80 0.86  CALCIUM 8.3* 8.4   Drain Cr: 0.4 = serum   Studies/Results: Ir Koreas Guide Bx Asp/drain  02/23/2014   CLINICAL DATA:  63 year old male with complex fluid collection in the anterior abdominal wall which appears to extend into the region of the urachus / anterior bladder. Imaging findings are concerning for urachal abscess necessitating into the anterior abdominal wall and significantly less likely locally advanced and centrally necrotic urachal malignancy. Bladder leak is also a theoretical possibility. Aspirated fluid will be sent for culture, cytology and creatinine.  EXAM: IR ULTRASOUND GUIDANCE  Date: 02/23/2014  PROCEDURE: 1. Ultrasound-guided placement of a 12 French drain into the anterior abdominal wall fluid collection. Interventional Radiologist:  Jeffrey BigHeath K. McCullough, MD  ANESTHESIA/SEDATION: None required.  TECHNIQUE: Informed consent was obtained from the patient following  explanation of the procedure, risks, benefits and alternatives. The patient understands, agrees and consents for the procedure. All questions were addressed. A time out was performed.  The suprapubic region was interrogated with ultrasound. There is a large irregular complex fluid collection in the subcutaneous soft tissues. A suitable skin entry site was selected and marked. The region was sterilely prepped and draped in the standard fashion using Betadine skin prep. Local anesthesia was attained by infiltration with 1% lidocaine. Under real-time sonographic guidance, an 18 gauge trocar needle was advanced into the fluid collection. A 0.035 inch wire was passed through the needle and coiled within the fluid collection. The tract was then dilated to 7812 JamaicaFrench and a Cook 12 [place]French all-purpose drainage catheter was advanced over the wire and formed within the fluid collection. Approximately 350 mL of frankly purulent material was then aspirated. Samples were sent for creatinine, cytology and culture.  The collapsed cavity was then flushed with approximately 60 cc of sterile saline and the drainage catheter connected to JP bulb suction. The catheter was secured to the skin with 0 Prolene suture and a sterile bandage was placed. The patient tolerated the procedure very well.  COMPLICATIONS: None  IMPRESSION: Technically successful placement of a 12 French drain into the suprapubic anterior abdominal wall fluid collection. Approximately 350 mL of frankly purulent material was aspirated. Samples were sent for creatinine, cytology and culture.  PLAN: 1. Maintain drain to JP bulb suction. 2. Flush with 5-10 mL sterile saline 3 times daily. 3. IR will follow drain. Signed,  Jeffrey BigHeath K. McCullough, MD  [facility]  Special[facility]  [facility]   Electronically Signed   By: Jeffrey MoanHeath  Pollard M.D.   On: 02/23/2014 18:01   Personally reviewed images: CT A/P Pollard/contrast: 11 suprapubic abscess that  appears to extend to and be continuous with the bladder. No evidence of intra- or extra-peritoneal bladder rupture.  CT cystogram: no evidence of communication with the bladder. It appears that the inflammation from the abscess extends up to the bladder but there is no communication and no fistula to the bowel.   Assessment/Plan: Abscess is drained, there is a small area on the CT scan that may be separate pocket left of midline which may not be drained.  Overall I think his infection better.  Rec: Continue broad spectrum abx and narrow once cultures return.  If he's enduration over the left side is not improving he may need a second drain placed or the area aspirated or at least opened.   Leave drain to bulb suction.  If he's ready for discharge over the weekend, the drain should remain in place and to suction.  Abx duration at least two weeks.  We will plan to see him clinic in the next 7-10 days.  We will continue to follow over the weekend, I will ask one of my partners to see him at least once.    LOS: 3 days   Jeffrey Pollard 02/25/2014, 7:23 AM

## 2014-02-26 DIAGNOSIS — N179 Acute kidney failure, unspecified: Secondary | ICD-10-CM

## 2014-02-26 LAB — CULTURE, ROUTINE-ABSCESS: Special Requests: NORMAL

## 2014-02-26 LAB — GLUCOSE, CAPILLARY: Glucose-Capillary: 119 mg/dL — ABNORMAL HIGH (ref 70–99)

## 2014-02-26 LAB — VANCOMYCIN, TROUGH: Vancomycin Tr: 22.6 ug/mL — ABNORMAL HIGH (ref 10.0–20.0)

## 2014-02-26 MED ORDER — SACCHAROMYCES BOULARDII 250 MG PO CAPS
250.0000 mg | ORAL_CAPSULE | Freq: Two times a day (BID) | ORAL | Status: DC
  Administered 2014-02-26 – 2014-02-27 (×3): 250 mg via ORAL
  Filled 2014-02-26 (×4): qty 1

## 2014-02-26 MED ORDER — INFLUENZA VAC SPLIT QUAD 0.5 ML IM SUSY
0.5000 mL | PREFILLED_SYRINGE | INTRAMUSCULAR | Status: AC
  Administered 2014-02-27: 0.5 mL via INTRAMUSCULAR
  Filled 2014-02-26 (×2): qty 0.5

## 2014-02-26 MED ORDER — SODIUM CHLORIDE 0.9 % IV SOLN: 1500.0000 mg | Freq: Two times a day (BID) | INTRAVENOUS | Status: DC

## 2014-02-26 MED ORDER — CLINDAMYCIN HCL 300 MG PO CAPS
300.0000 mg | ORAL_CAPSULE | Freq: Three times a day (TID) | ORAL | Status: DC
  Administered 2014-02-26 – 2014-02-27 (×4): 300 mg via ORAL
  Filled 2014-02-26 (×6): qty 1

## 2014-02-26 NOTE — Progress Notes (Signed)
Patient ID: Jeffrey Pollard, male   DOB: 06/29/1950, 63 y.o.   MRN: [id number]   Referring Physician(s): Urology  Subjective:  Suprapubic abscess drain placed 10/14 Better daily -- no complaints    Allergies: Review of patient's allergies indicates no known allergies.  Medications: Prior to Admission medications   Medication Sig Start Date End Date Taking? Authorizing Provider  atenolol (TENORMIN) 50 MG tablet Take 50 mg by mouth daily.     Yes Historical Provider, MD  Diethylpropion HCl 75 MG TB24 Take 75 mg by mouth daily.    Yes Historical Provider, MD  ibuprofen (ADVIL,MOTRIN) 200 MG tablet Take 200-800 mg by mouth every 6 (six) hours as needed for fever, headache or moderate pain.   Yes Historical Provider, MD  lisinopril-hydrochlorothiazide (PRINZIDE,ZESTORETIC) 10-12.5 MG per tablet Take 1 tablet by mouth daily.   Yes Historical Provider, MD  LORazepam (ATIVAN) 1 MG tablet Take 1 mg by mouth daily.     Yes Historical Provider, MD  pentazocine-naloxone (TALWIN NX) 50-0.5 MG per tablet Take 1 tablet by mouth daily.    Yes Historical Provider, MD  QUEtiapine (SEROQUEL) 100 MG tablet Take 100 mg by mouth daily.     Yes Historical Provider, MD  rosuvastatin (CRESTOR) 20 MG tablet Take 20 mg by mouth daily.     Yes Historical Provider, MD  venlafaxine (EFFEXOR-XR) 150 MG 24 hr capsule Take 150 mg by mouth daily.    Yes Historical Provider, MD    Review of Systems  Vital Signs: BP 143/64  Pulse 49  Temp(Src) 99 F (37.2 C) (Oral)  Resp 20  Ht 5\' 8"  (1.727 m)  Wt 99.111 kg (218 lb 8 oz)  BMI 33.23 kg/m2  SpO2 94%  Physical Exam  Abdominal:  Site of suprapubic drain sl tender Clean and dry No bleeding Output bloody with pus material  50 cc yesterday 30 cc in JP afeb Wbc wnl Cx: MRSA    Imaging: Ct Pelvis Wo Contrast  02/23/2014   CLINICAL DATA:  Evaluate for bladder fissure lobe. Abdominal wall abscess. Initial encounter.  EXAM: CT PELVIS WITHOUT CONTRAST   TECHNIQUE: Multidetector CT imaging of the pelvis was performed following the standard protocol without intravenous contrast.  COMPARISON:  02/22/2014 abdominal CT.  FINDINGS: 300 cc of iodinated contrast was injected through the patient's Foley catheter, with adequate distension of the urinary bladder. There is no contrast noted within the abscess or soft tissue at the bladder apex. The anterior bladder urothelium may be irregular in contour, although this could also be artifactual from mixing. There is no evidence of bowel fistula. Contrast in right lower quadrant small bowel is likely residual from previously administered oral contrast. These loops are discrete from the abscess and ventral bladder on previous imaging.  IMPRESSION: No cystographic evidence of communication between the bladder and the bowel/suprapubic abscess.   Electronically Signed   By: Tiburcio PeaJonathan  Watts M.D.   On: 02/23/2014 01:29   Ct Abdomen Pelvis W Contrast  02/22/2014   CLINICAL DATA:  The abdominal pain, lower pelvic. Palpable mass in the left lower quadrant.  EXAM: CT ABDOMEN AND PELVIS WITH CONTRAST  TECHNIQUE: Multidetector CT imaging of the abdomen and pelvis was performed using the standard protocol following bolus administration of intravenous contrast.  CONTRAST:  100mL OMNIPAQUE IOHEXOL 300 MG/ML  SOLN  COMPARISON:  None.  FINDINGS: BODY WALL: Extensive scarring in the mid and lower ventral abdomen. Reportedly the patient had abdominal surgery with colostomy formation remotely. There is  also a peripherally enhancing fluid collection within the subcutaneous suprapubic compartment measuring up to 11 x 6 x 8 cm in maximal dimension. This tracks to the infraumbilical abdominal wall, and is in continuity with the apex of the urinary bladder. The apex of the urinary bladder is thickened. This is raises the possibility of a urachal sinus/mass. Although the continuity is not at the umbilicus, there has been distortion of the abdominal  wall secondary to previous surgery.  LOWER CHEST: Unremarkable.  ABDOMEN/PELVIS:  Liver: 1 cm cyst in the lateral segment left liver.  Biliary: No evidence of biliary obstruction or stone.  Pancreas: Unremarkable.  Spleen: Unremarkable.  Adrenals: Unremarkable.  Kidneys and ureters: No hydronephrosis or stone.  Bladder: Thickening of the bladder apex as above.  Reproductive: Unremarkable.  Bowel: No obstruction. Normal appendix.  Retroperitoneum: No mass or adenopathy.  Peritoneum: No ascites or pneumoperitoneum.  Vascular: No acute abnormality.  OSSEOUS: No acute abnormalities.  IMPRESSION: Large abscess/fluid collection in the suprapubic subcutaneous compartment, up to 11 cm. The collection is in continuity with the thickened bladder apex, possible urachal sinus or carcinoma.   Electronically Signed   By: Tiburcio Pea M.D.   On: 02/22/2014 22:20   Ir US Guide Bx Asp/drain  02/23/2014   CLINICAL DATA:  63 year old male with complex fluid collection in the anterior abdominal wall which appears to extend into the region of the urachus / anterior bladder. Imaging findings are concerning for urachal abscess necessitating into the anterior abdominal wall and significantly less likely locally advanced and centrally necrotic urachal malignancy. Bladder leak is also a theoretical possibility. Aspirated fluid will be sent for culture, cytology and creatinine.  EXAM: IR ULTRASOUND GUIDANCE  Date: 02/23/2014  PROCEDURE: 1. Ultrasound-guided placement of a 12 French drain into the anterior abdominal wall fluid collection. Interventional Radiologist:  Sterling Big, MD  ANESTHESIA/SEDATION: None required.  TECHNIQUE: Informed consent was obtained from the patient following explanation of the procedure, risks, benefits and alternatives. The patient understands, agrees and consents for the procedure. All questions were addressed. A time out was performed.  The suprapubic region was interrogated with ultrasound. There  is a large irregular complex fluid collection in the subcutaneous soft tissues. A suitable skin entry site was selected and marked. The region was sterilely prepped and draped in the standard fashion using Betadine skin prep. Local anesthesia was attained by infiltration with 1% lidocaine. Under real-time sonographic guidance, an 18 gauge trocar needle was advanced into the fluid collection. A 0.035 inch wire was passed through the needle and coiled within the fluid collection. The tract was then dilated to 50 [place] and a Cook 12 [place] all-purpose drainage catheter was advanced over the wire and formed within the fluid collection. Approximately 350 mL of frankly purulent material was then aspirated. Samples were sent for creatinine, cytology and culture.  The collapsed cavity was then flushed with approximately 60 cc of sterile saline and the drainage catheter connected to JP bulb suction. The catheter was secured to the skin with 0 Prolene suture and a sterile bandage was placed. The patient tolerated the procedure very well.  COMPLICATIONS: None  IMPRESSION: Technically successful placement of a 12 French drain into the suprapubic anterior abdominal wall fluid collection. Approximately 350 mL of frankly purulent material was aspirated. Samples were sent for creatinine, cytology and culture.  PLAN: 1. Maintain drain to JP bulb suction. 2. Flush with 5-10 mL sterile saline 3 times daily. 3. IR will follow drain. Signed,  Vilma Prader  K. Archer AsaMcCullough, MD  [facility]  [facility]   Electronically Signed   By: Malachy MoanHeath  McCullough M.D.   On: 02/23/2014 18:01    Labs:  CBC:  Recent Labs  02/22/14 1830 02/23/14 0311 02/24/14 0409 02/25/14 0415  WBC 23.8* 19.4* 17.3* 9.9  HGB 12.9* 11.5* 10.0* 10.3*  HCT 40.2 35.0* 30.7* 31.9*  PLT 715* 539* 481* 499*    COAGS:  Recent Labs  02/23/14 1205  INR 1.32    BMP:  Recent Labs  02/22/14 1830 02/23/14 0311  02/24/14 0409 02/25/14 0415  NA 143 136* 136* 140  K 3.6* 2.6* 2.7* 2.9*  CL 97 95* 95* 98  CO2 26 25 26  32  GLUCOSE 104* 112* 99 116*  BUN 31* 25* 15 14  CALCIUM 9.7 8.7 8.3* 8.4  CREATININE 1.55* 1.00 0.80 0.86  GFRNONAA 45* 77* >90 89*  GFRAA 53* 89* >90 >90    LIVER FUNCTION TESTS:  Recent Labs  02/22/14 1830 02/23/14 0311 02/24/14 0409 02/25/14 0415  BILITOT 0.3 0.5 0.4 0.2*  AST 18 13 27 19   ALT 26 20 18 17   ALKPHOS 135* 112 112 86  PROT 7.8 6.8 6.2 6.1  ALBUMIN 2.9* 2.6* 2.2* 2.2*    Assessment and Plan:  Supra pubic abscess drain intact Will follow Plan per Uro   I spent a total of 15 minutes face to face in clinical consultation/evaluation, greater than 50% of which was counseling/coordinating care for suprapubic abscess drain  Signed: Iridiana Fonner A 02/26/2014, 12:29 PM

## 2014-02-26 NOTE — Progress Notes (Signed)
ANTIBIOTIC CONSULT NOTE - FOLLOW UP  Pharmacy Consult for Vancomycin, Zosyn Indication: abdominal wall abscess  No Known Allergies  Patient Measurements: Height: 5\' 8"  (172.7 cm) Weight: 218 lb 8 oz (99.111 kg) IBW/kg (Calculated) : 68.4   Vital Signs: Temp: 99 F (37.2 C) (10/17 0507) Temp Source: Oral (10/17 0507) BP: 143/64 mmHg (10/17 0507) Pulse Rate: 49 (10/17 0507) Intake/Output from previous day: 10/16 0701 - 10/17 0700 In: 555 [P.O.:540] Out: 50 [Drains:50] Intake/Output from this shift:    Labs:  Recent Labs  02/24/14 0409 02/25/14 0415  WBC 17.3* 9.9  HGB 10.0* 10.3*  PLT 481* 499*  CREATININE 0.80 0.86   Estimated Creatinine Clearance: 97.7 ml/min (by C-G formula based on Cr of 0.86).  Recent Labs  02/24/14 1433 02/26/14 0747  VANCOTROUGH 13.5 22.6*     Microbiology: Recent Results (from the past 720 hour(s))  CULTURE, BLOOD (ROUTINE X 2)     Status: None   Collection Time    02/23/14  2:26 AM      Result Value Ref Range Status   Specimen Description BLOOD LEFT WRIST   Final   Special Requests BOTTLES DRAWN AEROBIC ONLY 3CC   Final   Culture  Setup Time     Final   Value: 02/23/2014 08:47     Performed at [facility]   Culture     Final   Value:        BLOOD CULTURE RECEIVED NO GROWTH TO DATE CULTURE WILL BE HELD FOR 5 DAYS BEFORE ISSUING A FINAL NEGATIVE REPORT     Performed at [facility]   Report Status PENDING   Incomplete  CULTURE, BLOOD (ROUTINE X 2)     Status: None   Collection Time    02/23/14  2:31 AM      Result Value Ref Range Status   Specimen Description BLOOD LEFT ARM   Final   Special Requests BOTTLES DRAWN AEROBIC AND ANAEROBIC 10CC   Final   Culture  Setup Time     Final   Value: 02/23/2014 08:47     Performed at [facility]   Culture     Final   Value:        BLOOD CULTURE RECEIVED NO GROWTH TO DATE CULTURE WILL BE HELD FOR 5 DAYS BEFORE ISSUING A FINAL NEGATIVE REPORT     Performed at  [facility]   Report Status PENDING   Incomplete  CULTURE, ROUTINE-ABSCESS     Status: None   Collection Time    02/23/14  5:51 PM      Result Value Ref Range Status   Specimen Description ABSCESS   Final   Special Requests Normal   Final   Gram Stain     Final   Value: ABUNDANT WBC PRESENT, PREDOMINANTLY PMN     NO SQUAMOUS EPITHELIAL CELLS SEEN     FEW GRAM POSITIVE COCCI     IN PAIRS IN CLUSTERS     Performed at [facility]   Culture     Final   Value: ABUNDANT STAPHYLOCOCCUS AUREUS     Note: RIFAMPIN AND GENTAMICIN SHOULD NOT BE USED AS SINGLE DRUGS FOR TREATMENT OF STAPH INFECTIONS.     Performed at [facility]   Report Status PENDING   Incomplete     Assessment: 63 yo male presented with suprapubic swelling/abscess and celluitis. PMH includes prior colostomy for perforated diverticulitis which was reversed. Pt noted increased abdominal swelling  over last couple of days as well. Per urology, there was question of possible bladder fistula but was found not to be the case per CT.  Pharmacy is consulted to dose vancomycin and Zosyn for abscess.  S/p suprapubic abscess drainage on 10/14.  Anti-infectives:  10/14 >> vanc >> 10/14 >> zosyn >>  Tmax: 99.2 WBCs: decreased to WNL, 9.9 Renal: SCr 0.86, CrCl ~ 98 ml/min CG and 87 N  10/14 blood: ngtd 10/14 urine: canceled 10/14 Abscess: abundant S. aureus  Dose changes/drug level info:  10/15 1500 VT = 13.5 On 1g q8h (before 6th dose) increase to 1250mg  Q8H 10/17 0800 VT = 22.6 on 1250mg  q8h (before  6th dose), decrease dose to 1500mg  IV q12h for est Pk = 10, trough = 17   Goal of Therapy:  Vancomycin trough level 15-20 mcg/ml  Plan:   Continue Zosyn 3.375g IV Q8H infused over 4hrs.   Change vancomycin to 1500mg  IV q12h  Follow up renal fxn and culture results.  Juliette Alcideustin Zeigler, PharmD, BCPS.   Pager: [phone] 02/26/2014 9:37 AM

## 2014-02-26 NOTE — Progress Notes (Signed)
Urology Progress Note   Subjective:     No acute urologic events overnight. Drain continues. Ambulation:   positive Flatus:    positive Bowel movement  positive  Pain: some relief  Objective:  Blood pressure 143/64, pulse 49, temperature 99 F (37.2 C), temperature source Oral, resp. rate 20, height 5\' 8"  (1.727 m), weight 99.111 kg (218 lb 8 oz), SpO2 94.00%.  Physical Exam:  General:  No acute distress, awake  Genitourinary:  s-p drain i place. Slight erythema around drain insertion site.  Foley: no change    I/O last 3 completed shifts: In: 555 [P.O.:540; Other:15] Out: 515 [Urine:450; Drains:65]  Recent Labs     02/24/14  0409  02/25/14  0415  HGB  10.0*  10.3*  WBC  17.3*  9.9  PLT  481*  499*    Recent Labs     02/24/14  0409  02/25/14  0415  NA  136*  140  K  2.7*  2.9*  CL  95*  98  CO2  26  32  BUN  15  14  CREATININE  0.80  0.86  CALCIUM  8.3*  8.4  GFRNONAA  >90  89*  GFRAA  >90  >90     Recent Labs     02/23/14  1205  INR  1.32     No components found with this basename: ABG,   Assessment/Plan:  Wound care discussed.

## 2014-02-26 NOTE — Progress Notes (Signed)
[facility] PROGRESS NOTE  DIERKS WACH WUJ:[id number] DOB: [birth year] DOA: 02/22/2014 PCP: Willey Blade, MD  Assessment/Plan: 1. Groin abscess with sepsis -urology was consulted, appreciate input -Had been on empiric vanc and zosyn -CT scan without bladder involvement -Pt s/p IR drainage of abscess 10/14 -Cultures now pos for MRSA -Transition above abx to clindamycin with probiotic -Per IR, d/c drain when drain output is <10cc  -Urology to f/u in 7-10 days 2. Acute renal failure -hydrate with IVF -Cr now improved  3. Hypercholesterolemia -will continue with home medications  4. Hypertension -will continue with home medications as tolerated -BP stable and controlled 5. Cellulitis of the suprapubic area  -antibiotics as per above -IR drainage 10/14 6. Mental status change - See recent events. Question if patient is taking own meds at night. Urine drug screen pos for barbituates that he is not receiving here. - Cont monitor closely - No further episodes - Appears well  Code Status: Full Family Communication: Pt in room  Disposition Plan: Pending   Consultants:  Urology  IR  Procedures:  Drain of abscess 10/14  Antibiotics:  Vanc 10/13>>>10/17  Zosyn 10/13>>> 10/17  Clindamycin 10/17>>>  HPI/Subjective: Pt is without complaints.  Objective: Filed Vitals:   02/25/14 1409 02/25/14 2300 02/26/14 0507 02/26/14 1645  BP: 117/66 120/71 143/64 112/72  Pulse: 66 61 49 52  Temp: 98.3 F (36.8 C) 99.2 F (37.3 C) 99 F (37.2 C) 98.6 F (37 C)  TempSrc: Oral Oral Oral Oral  Resp: 20 20 20 18   Height:      Weight:      SpO2: 94% 96% 94% 94%    Intake/Output Summary (Last 24 hours) at 02/26/14 1727 Last data filed at 02/26/14 1500  Gross per 24 hour  Intake    545 ml  Output     60 ml  Net    485 ml   Filed Weights   02/23/14 0114 02/23/14 0200  Weight: 98.431 kg (217 lb) 99.111 kg (218 lb 8 oz)    Exam:   General:  Awake, in  nad  Cardiovascular: regular, s1, s2  Respiratory: normal resp effort, no wheezing  Abdomen: soft, nondistended, palpable tender fluctuance over LLQ  Musculoskeletal: perfused, no clubbing   Data Reviewed: Basic Metabolic Panel:  Recent Labs Lab 02/22/14 1830 02/23/14 0311 02/24/14 0409 02/25/14 0415  NA 143 136* 136* 140  K 3.6* 2.6* 2.7* 2.9*  CL 97 95* 95* 98  CO2 26 25 26  32  GLUCOSE 104* 112* 99 116*  BUN 31* 25* 15 14  CREATININE 1.55* 1.00 0.80 0.86  CALCIUM 9.7 8.7 8.3* 8.4  MG  --  2.0  --   --    Liver Function Tests:  Recent Labs Lab 02/22/14 1830 02/23/14 0311 02/24/14 0409 02/25/14 0415  AST 18 13 27 19   ALT 26 20 18 17   ALKPHOS 135* 112 112 86  BILITOT 0.3 0.5 0.4 0.2*  PROT 7.8 6.8 6.2 6.1  ALBUMIN 2.9* 2.6* 2.2* 2.2*    Recent Labs Lab 02/22/14 1830  LIPASE 26    Recent Labs Lab 02/24/14 0610  AMMONIA 23   CBC:  Recent Labs Lab 02/22/14 1830 02/23/14 0311 02/24/14 0409 02/25/14 0415  WBC 23.8* 19.4* 17.3* 9.9  NEUTROABS 19.6*  --   --   --   HGB 12.9* 11.5* 10.0* 10.3*  HCT 40.2 35.0* 30.7* 31.9*  MCV 94.1 92.1 93.3 93.0  PLT 715* 539* 481* 499*   Cardiac Enzymes:  No results found for this basename: CKTOTAL, CKMB, CKMBINDEX, TROPONINI,  in the last 168 hours BNP (last 3 results) No results found for this basename: PROBNP,  in the last 8760 hours CBG:  Recent Labs Lab 02/23/14 0743 02/24/14 0720 02/24/14 2304 02/25/14 0728 02/26/14 0918  GLUCAP 108* 99 149* 95 119*    Recent Results (from the past 240 hour(s))  CULTURE, BLOOD (ROUTINE X 2)     Status: None   Collection Time    02/23/14  2:26 AM      Result Value Ref Range Status   Specimen Description BLOOD LEFT WRIST   Final   Special Requests BOTTLES DRAWN AEROBIC ONLY 3CC   Final   Culture  Setup Time     Final   Value: 02/23/2014 08:47     Performed at [facility]   Culture     Final   Value:        BLOOD CULTURE RECEIVED NO GROWTH TO DATE  CULTURE WILL BE HELD FOR 5 DAYS BEFORE ISSUING A FINAL NEGATIVE REPORT     Performed at [facility]   Report Status PENDING   Incomplete  CULTURE, BLOOD (ROUTINE X 2)     Status: None   Collection Time    02/23/14  2:31 AM      Result Value Ref Range Status   Specimen Description BLOOD LEFT ARM   Final   Special Requests BOTTLES DRAWN AEROBIC AND ANAEROBIC 10CC   Final   Culture  Setup Time     Final   Value: 02/23/2014 08:47     Performed at [facility]   Culture     Final   Value:        BLOOD CULTURE RECEIVED NO GROWTH TO DATE CULTURE WILL BE HELD FOR 5 DAYS BEFORE ISSUING A FINAL NEGATIVE REPORT     Performed at [facility]   Report Status PENDING   Incomplete  CULTURE, ROUTINE-ABSCESS     Status: None   Collection Time    02/23/14  5:51 PM      Result Value Ref Range Status   Specimen Description ABSCESS   Final   Special Requests Normal   Final   Gram Stain     Final   Value: ABUNDANT WBC PRESENT, PREDOMINANTLY PMN     NO SQUAMOUS EPITHELIAL CELLS SEEN     FEW GRAM POSITIVE COCCI     IN PAIRS IN CLUSTERS     Performed at [facility]   Culture     Final   Value: ABUNDANT METHICILLIN RESISTANT STAPHYLOCOCCUS AUREUS     Note: RIFAMPIN AND GENTAMICIN SHOULD NOT BE USED AS SINGLE DRUGS FOR TREATMENT OF STAPH INFECTIONS. CRITICAL RESULT CALLED TO, READ BACK BY AND VERIFIED WITH: SPESS BLAINE @ 9:59AM 02/26/14 BY DWEEKS     Performed at [facility]   Report Status 02/26/2014 FINAL   Final   Organism ID, Bacteria METHICILLIN RESISTANT STAPHYLOCOCCUS AUREUS   Final     Studies: No results found.  Scheduled Meds: . atenolol  50 mg Oral Daily  . clindamycin  300 mg Oral 3 times per day  . folic acid  1 mg Oral Daily  . heparin  5,000 Units Subcutaneous 3 times per day  . hydrochlorothiazide  12.5 mg Oral Daily  . [START ON 02/27/2014] Influenza vac split quadrivalent PF  0.5 mL Intramuscular Tomorrow-1000  . lisinopril  5 mg Oral  Daily  .  LORazepam  1 mg Oral Daily  . multivitamin with minerals  1 tablet Oral Daily  . pentazocine-naloxone  1 tablet Oral Daily  . QUEtiapine  100 mg Oral Daily  . rosuvastatin  20 mg Oral q1800  . saccharomyces boulardii  250 mg Oral BID  . thiamine  100 mg Oral Daily  . venlafaxine XR  150 mg Oral Daily   Continuous Infusions: . sodium chloride 50 mL/hr at 02/25/14 0609    Active Problems:   Hypertension   Hypercholesterolemia   Acute renal failure   Urachal sinus   Bladder fistula  Time spent: 35min  Iliyah Bui K  [facility] Pager [phone]. If 7PM-7AM, please contact night-coverage at [email or web address], password [facility] 02/26/2014, 5:27 PM  LOS: 4 days

## 2014-02-26 NOTE — ED Provider Notes (Signed)
Medical screening examination/treatment/procedure(s) were conducted as a shared visit with non-physician practitioner(s) and myself.  I personally evaluated the patient during the encounter.   EKG Interpretation None      Pt with erythema and fluctuant area in the suprapubic area with leukocytosis and mild elevation in lactate.  Pt in not distress and reading a book in the room.  CT with fluid filled collection unclear source.  Gwyneth SproutWhitney Sultan Pargas, MD 02/26/14 [phone]

## 2014-02-27 DIAGNOSIS — A4102 Sepsis due to Methicillin resistant Staphylococcus aureus: Secondary | ICD-10-CM | POA: Diagnosis not present

## 2014-02-27 LAB — BASIC METABOLIC PANEL
Anion gap: 13 (ref 5–15)
BUN: 13 mg/dL (ref 6–23)
CO2: 29 mEq/L (ref 19–32)
Calcium: 9 mg/dL (ref 8.4–10.5)
Chloride: 102 mEq/L (ref 96–112)
Creatinine, Ser: 0.82 mg/dL (ref 0.50–1.35)
GFR calc Af Amer: >90 mL/min (ref >90)
Glucose, Bld: 108 mg/dL — ABNORMAL HIGH (ref 70–99)
POTASSIUM: 3.1 meq/L — AB (ref 3.7–5.3)
SODIUM: 144 meq/L (ref 137–147)

## 2014-02-27 LAB — CBC
HCT: 37.3 % — ABNORMAL LOW (ref 39.0–52.0)
Hemoglobin: 11.8 g/dL — ABNORMAL LOW (ref 13.0–17.0)
MCH: 30.1 pg (ref 26.0–34.0)
MCHC: 31.6 g/dL (ref 30.0–36.0)
MCV: 95.2 fL (ref 78.0–100.0)
Platelets: 573 10*3/uL — ABNORMAL HIGH (ref 150–400)
RBC: 3.92 MIL/uL — ABNORMAL LOW (ref 4.22–5.81)
RDW: 12.8 % (ref 11.5–15.5)
WBC: 9.6 10*3/uL (ref 4.0–10.5)

## 2014-02-27 LAB — GLUCOSE, CAPILLARY: GLUCOSE-CAPILLARY: 98 mg/dL (ref 70–99)

## 2014-02-27 MED ORDER — SACCHAROMYCES BOULARDII 250 MG PO CAPS: 250.0000 mg | ORAL_CAPSULE | Freq: Two times a day (BID) | ORAL | Status: AC

## 2014-02-27 MED ORDER — CLINDAMYCIN HCL 300 MG PO CAPS: 300.0000 mg | ORAL_CAPSULE | Freq: Three times a day (TID) | ORAL | Status: AC

## 2014-02-27 MED ORDER — POTASSIUM CHLORIDE ER 10 MEQ PO TBCR: 40.0000 meq | EXTENDED_RELEASE_TABLET | Freq: Every day | ORAL | Status: AC

## 2014-02-27 MED ORDER — POTASSIUM CHLORIDE CRYS ER 20 MEQ PO TBCR
40.0000 meq | EXTENDED_RELEASE_TABLET | Freq: Two times a day (BID) | ORAL | Status: DC
  Administered 2014-02-27: 40 meq via ORAL
  Filled 2014-02-27: qty 2

## 2014-02-27 NOTE — Discharge Summary (Signed)
Physician Discharge Summary  Jeffrey Pollard ZOX:[id number] DOB: [birth year] DOA: 02/22/2014  PCP: Willey Blade, MD  Admit date: 02/22/2014 Discharge date: 02/27/2014  Time spent: 35 minutes  Recommendations for Outpatient Follow-up:  1. Follow up with PCP in 1-2 weeks 2. Follow up with Dr. Marlou Porch in 7-10 days 3. D/c drain when output is <10cc 4. Would repeat basic metabolic panel in 1 week, focusing on K level  Discharge Diagnoses:  Active Problems:   Hypertension   Hypercholesterolemia   Acute renal failure   Urachal sinus   Bladder fistula   Discharge Condition: Improved  Diet recommendation: Regular  Filed Weights   02/23/14 0114 02/23/14 0200  Weight: 98.431 kg (217 lb) 99.111 kg (218 lb 8 oz)    History of present illness:  See admit h and p from 10/14 for details. Briefly, pt presents with suprapubic swelling and concerns of overlying cellulitis. Initial imaging was concerning for bladder fistula. The patient was admitted to the inpatient service.  Hospital Course:  1. Groin abscess with sepsis -urology was consulted, appreciate input  -Had been initially on empiric vanc and zosyn  -CT scan without bladder involvement  -Pt s/p IR drainage of abscess 10/14  -Cultures pos for MRSA  -Transitioned above abx to clindamycin with probiotic  -Per IR, d/c drain when drain output is <10cc  -Urology to f/u in 7-10 days  2. Acute renal failure -hydrated with IVF  -Cr improved  3. Hypercholesterolemia -continued with home medications  4. Hypertension -continued with home medications as tolerated  -BP stable and controlled  5. Cellulitis of the suprapubic area  -antibiotics as per above  -IR drainage 10/14  6. Mental status change  - Question if patient is taking own meds at night. Urine drug screen pos for barbituates that he is not receiving here.  - Cont monitor closely  - No further episodes  - Appears well  Procedures:  IR drainage of abscess  10/14  Consultations:  IR  Urology  Discharge Exam: Filed Vitals:   02/26/14 0507 02/26/14 1645 02/26/14 2259 02/27/14 0445  BP: 143/64 112/72 124/61 129/73  Pulse: 49 52 52 50  Temp: 99 F (37.2 C) 98.6 F (37 C) 98.6 F (37 C) 97.6 F (36.4 C)  TempSrc: Oral Oral Oral Oral  Resp: 20 18 24 20   Height:      Weight:      SpO2: 94% 94% 90% 98%    General: Awake, in nad Cardiovascular: regular, s1, s2 Respiratory: normal resp effort, no wheezing  Discharge Instructions     Medication List         atenolol 50 MG tablet  Commonly known as:  TENORMIN  Take 50 mg by mouth daily.     clindamycin 300 MG capsule  Commonly known as:  CLEOCIN  Take 1 capsule (300 mg total) by mouth every 8 (eight) hours.     CRESTOR 20 MG tablet  Generic drug:  rosuvastatin  Take 20 mg by mouth daily.     Diethylpropion HCl 75 MG Tb24  Take 75 mg by mouth daily.     ibuprofen 200 MG tablet  Commonly known as:  ADVIL,MOTRIN  Take 200-800 mg by mouth every 6 (six) hours as needed for fever, headache or moderate pain.     lisinopril-hydrochlorothiazide 10-12.5 MG per tablet  Commonly known as:  PRINZIDE,ZESTORETIC  Take 1 tablet by mouth daily.     LORazepam 1 MG tablet  Commonly known as:  ATIVAN  Take 1 mg by mouth daily.     pentazocine-naloxone 50-0.5 MG per tablet  Commonly known as:  TALWIN NX  Take 1 tablet by mouth daily.     potassium chloride 10 MEQ tablet  Commonly known as:  K-DUR  Take 4 tablets (40 mEq total) by mouth daily.     saccharomyces boulardii 250 MG capsule  Commonly known as:  FLORASTOR  Take 1 capsule (250 mg total) by mouth 2 (two) times daily.     SEROQUEL 100 MG tablet  Generic drug:  QUEtiapine  Take 100 mg by mouth daily.     venlafaxine XR 150 MG 24 hr capsule  Commonly known as:  EFFEXOR-XR  Take 150 mg by mouth daily.       No Known Allergies Follow-up Information   Follow up with August SaucerEAN, ERIC, MD. Schedule an appointment as soon  as possible for a visit in 1 week.   Specialty:  Internal Medicine   Contact information:   [facility] [address] [place] [address] [phone]       Follow up with Crist FatHERRICK, [name], MD. Schedule an appointment as soon as possible for a visit in 1 week.   Specialty:  Urology   Contact information:   [address] [place] [address] [phone]        The results of significant diagnostics from this hospitalization (including imaging, microbiology, ancillary and laboratory) are listed below for reference.    Significant Diagnostic Studies: Ct Pelvis Wo Contrast  02/23/2014   CLINICAL DATA:  Evaluate for bladder fissure lobe. Abdominal wall abscess. Initial encounter.  EXAM: CT PELVIS WITHOUT CONTRAST  TECHNIQUE: Multidetector CT imaging of the pelvis was performed following the standard protocol without intravenous contrast.  COMPARISON:  02/22/2014 abdominal CT.  FINDINGS: 300 cc of iodinated contrast was injected through the patient's Foley catheter, with adequate distension of the urinary bladder. There is no contrast noted within the abscess or soft tissue at the bladder apex. The anterior bladder urothelium may be irregular in contour, although this could also be artifactual from mixing. There is no evidence of bowel fistula. Contrast in right lower quadrant small bowel is likely residual from previously administered oral contrast. These loops are discrete from the abscess and ventral bladder on previous imaging.  IMPRESSION: No cystographic evidence of communication between the bladder and the bowel/suprapubic abscess.   Electronically Signed   By: Tiburcio PeaJonathan  Watts M.D.   On: 02/23/2014 01:29   Ct Abdomen Pelvis W Contrast  02/22/2014   CLINICAL DATA:  The abdominal pain, lower pelvic. Palpable mass in the left lower quadrant.  EXAM: CT ABDOMEN AND PELVIS WITH CONTRAST  TECHNIQUE: Multidetector CT imaging of the abdomen and pelvis was performed  using the standard protocol following bolus administration of intravenous contrast.  CONTRAST:  100mL OMNIPAQUE IOHEXOL 300 MG/ML  SOLN  COMPARISON:  None.  FINDINGS: BODY WALL: Extensive scarring in the mid and lower ventral abdomen. Reportedly the patient had abdominal surgery with colostomy formation remotely. There is also a peripherally enhancing fluid collection within the subcutaneous suprapubic compartment measuring up to 11 x 6 x 8 cm in maximal dimension. This tracks to the infraumbilical abdominal wall, and is in continuity with the apex of the urinary bladder. The apex of the urinary bladder is thickened. This is raises the possibility of a urachal sinus/mass. Although the continuity is not at the umbilicus, there has been distortion of the abdominal wall secondary to  previous surgery.  LOWER CHEST: Unremarkable.  ABDOMEN/PELVIS:  Liver: 1 cm cyst in the lateral segment left liver.  Biliary: No evidence of biliary obstruction or stone.  Pancreas: Unremarkable.  Spleen: Unremarkable.  Adrenals: Unremarkable.  Kidneys and ureters: No hydronephrosis or stone.  Bladder: Thickening of the bladder apex as above.  Reproductive: Unremarkable.  Bowel: No obstruction. Normal appendix.  Retroperitoneum: No mass or adenopathy.  Peritoneum: No ascites or pneumoperitoneum.  Vascular: No acute abnormality.  OSSEOUS: No acute abnormalities.  IMPRESSION: Large abscess/fluid collection in the suprapubic subcutaneous compartment, up to 11 cm. The collection is in continuity with the thickened bladder apex, possible urachal sinus or carcinoma.   Electronically Signed   By: Tiburcio Pea M.D.   On: 02/22/2014 22:20   Ir US Guide Bx Asp/drain  02/23/2014   CLINICAL DATA:  63 year old male with complex fluid collection in the anterior abdominal wall which appears to extend into the region of the urachus / anterior bladder. Imaging findings are concerning for urachal abscess necessitating into the anterior abdominal wall  and significantly less likely locally advanced and centrally necrotic urachal malignancy. Bladder leak is also a theoretical possibility. Aspirated fluid will be sent for culture, cytology and creatinine.  EXAM: IR ULTRASOUND GUIDANCE  Date: 02/23/2014  PROCEDURE: 1. Ultrasound-guided placement of a 12 French drain into the anterior abdominal wall fluid collection. Interventional Radiologist:  Sterling Big, MD  ANESTHESIA/SEDATION: None required.  TECHNIQUE: Informed consent was obtained from the patient following explanation of the procedure, risks, benefits and alternatives. The patient understands, agrees and consents for the procedure. All questions were addressed. A time out was performed.  The suprapubic region was interrogated with ultrasound. There is a large irregular complex fluid collection in the subcutaneous soft tissues. A suitable skin entry site was selected and marked. The region was sterilely prepped and draped in the standard fashion using Betadine skin prep. Local anesthesia was attained by infiltration with 1% lidocaine. Under real-time sonographic guidance, an 18 gauge trocar needle was advanced into the fluid collection. A 0.035 inch wire was passed through the needle and coiled within the fluid collection. The tract was then dilated to 72 [place] and a Cook 12 [place] all-purpose drainage catheter was advanced over the wire and formed within the fluid collection. Approximately 350 mL of frankly purulent material was then aspirated. Samples were sent for creatinine, cytology and culture.  The collapsed cavity was then flushed with approximately 60 cc of sterile saline and the drainage catheter connected to JP bulb suction. The catheter was secured to the skin with 0 Prolene suture and a sterile bandage was placed. The patient tolerated the procedure very well.  COMPLICATIONS: None  IMPRESSION: Technically successful placement of a 12 French drain into the suprapubic anterior abdominal  wall fluid collection. Approximately 350 mL of frankly purulent material was aspirated. Samples were sent for creatinine, cytology and culture.  PLAN: 1. Maintain drain to JP bulb suction. 2. Flush with 5-10 mL sterile saline 3 times daily. 3. IR will follow drain. Signed,  Sterling Big, MD  [facility]  [facility]   Electronically Signed   By: Malachy Moan M.D.   On: 02/23/2014 18:01    Microbiology: Recent Results (from the past 240 hour(s))  CULTURE, BLOOD (ROUTINE X 2)     Status: None   Collection Time    02/23/14  2:26 AM      Result Value Ref Range Status   Specimen Description  BLOOD LEFT WRIST   Final   Special Requests BOTTLES DRAWN AEROBIC ONLY 3CC   Final   Culture  Setup Time     Final   Value: 02/23/2014 08:47     Performed at [facility]   Culture     Final   Value:        BLOOD CULTURE RECEIVED NO GROWTH TO DATE CULTURE WILL BE HELD FOR 5 DAYS BEFORE ISSUING A FINAL NEGATIVE REPORT     Performed at [facility]   Report Status PENDING   Incomplete  CULTURE, BLOOD (ROUTINE X 2)     Status: None   Collection Time    02/23/14  2:31 AM      Result Value Ref Range Status   Specimen Description BLOOD LEFT ARM   Final   Special Requests BOTTLES DRAWN AEROBIC AND ANAEROBIC 10CC   Final   Culture  Setup Time     Final   Value: 02/23/2014 08:47     Performed at [facility]   Culture     Final   Value:        BLOOD CULTURE RECEIVED NO GROWTH TO DATE CULTURE WILL BE HELD FOR 5 DAYS BEFORE ISSUING A FINAL NEGATIVE REPORT     Performed at [facility]   Report Status PENDING   Incomplete  CULTURE, ROUTINE-ABSCESS     Status: None   Collection Time    02/23/14  5:51 PM      Result Value Ref Range Status   Specimen Description ABSCESS   Final   Special Requests Normal   Final   Gram Stain     Final   Value: ABUNDANT WBC PRESENT, PREDOMINANTLY PMN     NO SQUAMOUS EPITHELIAL CELLS SEEN      FEW GRAM POSITIVE COCCI     IN PAIRS IN CLUSTERS     Performed at [facility]   Culture     Final   Value: ABUNDANT METHICILLIN RESISTANT STAPHYLOCOCCUS AUREUS     Note: RIFAMPIN AND GENTAMICIN SHOULD NOT BE USED AS SINGLE DRUGS FOR TREATMENT OF STAPH INFECTIONS. CRITICAL RESULT CALLED TO, READ BACK BY AND VERIFIED WITH: SPESS BLAINE @ 9:59AM 02/26/14 BY DWEEKS     Performed at [facility]   Report Status 02/26/2014 FINAL   Final   Organism ID, Bacteria METHICILLIN RESISTANT STAPHYLOCOCCUS AUREUS   Final     Labs: Basic Metabolic Panel:  Recent Labs Lab 02/22/14 1830 02/23/14 0311 02/24/14 0409 02/25/14 0415 02/27/14 0852  NA 143 136* 136* 140 144  K 3.6* 2.6* 2.7* 2.9* 3.1*  CL 97 95* 95* 98 102  CO2 26 25 26  32 29  GLUCOSE 104* 112* 99 116* 108*  BUN 31* 25* 15 14 13   CREATININE 1.55* 1.00 0.80 0.86 0.82  CALCIUM 9.7 8.7 8.3* 8.4 9.0  MG  --  2.0  --   --   --    Liver Function Tests:  Recent Labs Lab 02/22/14 1830 02/23/14 0311 02/24/14 0409 02/25/14 0415  AST 18 13 27 19   ALT 26 20 18 17   ALKPHOS 135* 112 112 86  BILITOT 0.3 0.5 0.4 0.2*  PROT 7.8 6.8 6.2 6.1  ALBUMIN 2.9* 2.6* 2.2* 2.2*    Recent Labs Lab 02/22/14 1830  LIPASE 26    Recent Labs Lab 02/24/14 0610  AMMONIA 23   CBC:  Recent Labs Lab 02/22/14 1830 02/23/14 0311 02/24/14 0409 02/25/14 0415 02/27/14 [id number]  WBC 23.8* 19.4* 17.3* 9.9 9.6  NEUTROABS 19.6*  --   --   --   --   HGB 12.9* 11.5* 10.0* 10.3* 11.8*  HCT 40.2 35.0* 30.7* 31.9* 37.3*  MCV 94.1 92.1 93.3 93.0 95.2  PLT 715* 539* 481* 499* 573*   Cardiac Enzymes: No results found for this basename: CKTOTAL, CKMB, CKMBINDEX, TROPONINI,  in the last 168 hours BNP: BNP (last 3 results) No results found for this basename: PROBNP,  in the last 8760 hours CBG:  Recent Labs Lab 02/24/14 0720 02/24/14 2304 02/25/14 0728 02/26/14 0918 02/27/14 0744  GLUCAP 99 149* 95 119* 98   Signed:  CHIU,  STEPHEN K  [facility] 02/27/2014, 11:55 AM

## 2014-02-27 NOTE — Progress Notes (Signed)
CARE MANAGEMENT NOTE 02/27/2014  Patient:  Jeffrey Pollard,Jeffrey Pollard   Account Number:  [id number]  Date Initiated:  02/24/2014  Documentation initiated by:  Ezekiel InaMcGIBBONEY,COOKIE  Subjective/Objective Assessment:   Pt admitted with hypertension     Action/Plan:   from home   Anticipated DC Date:  02/26/2014   Anticipated DC Plan:  HOME/SELF CARE      DC Planning Services  CM consult      Choice offered to / List presented to:             Status of service:  Completed, signed off Medicare Important Message given?  YES (If response is "NO", the following Medicare IM given date fields will be blank) Date Medicare IM given:  02/24/2014 Medicare IM given by:  Ezekiel InaMcGIBBONEY,COOKIE Date Additional Medicare IM given:  02/27/2014 Additional Medicare IM given by:  Isidoro DonningALESIA Helmi Hechavarria  Discharge Disposition:  HOME/SELF CARE  Per UR Regulation:  Reviewed for med. necessity/level of care/duration of stay  If discussed at Long Length of Stay Meetings, dates discussed:    Comments:  02/27/2014 1430 NCM spoke to pt and no NCM needs identified. Live at home with sister. Isidoro DonningAlesia Teleah Villamar RN CCM Case Mgmt phone [phone]  02/24/14 MMcGibboney, RN, BSN Chart reviewed.

## 2014-02-27 NOTE — Progress Notes (Signed)
Urology Progress Note : MRSA  1. Subjective: MRSA sepsis. Suprapubic abscess. Groin abscess with sepsis -urology was consulted, appreciate input  -Currently remains on vanc and zosyn  -CT scan without bladder involvement  -Pt s/p IR drainage of abscess 10/14  -Follow up on cultures, rare gm pos organisms thus far  -Per IR, d/c drain when drain output is <10cc  -Urology to f/u in 7-10 days  -Ultimately will transition abx pending culture results    No acute urologic events overnight. Ambulation:   positive Flatus:    positive Bowel movement  positive  Pain: complete resolution  Objective:  Blood pressure 129/73, pulse 50, temperature 97.6 F (36.4 C), temperature source Oral, resp. rate 20, height 5\' 8"  (1.727 m), weight 99.111 kg (218 lb 8 oz), SpO2 98.00%.  Physical Exam:  General:  No acute distress, awake  Genitourinary:    Drain in place.  Foley: out    I/O last 3 completed shifts: In: 1335 [P.O.:780; I.V.:550; Other:5] Out: 80 [Drains:80]  Recent Labs     02/25/14  0415  02/27/14  0852  HGB  10.3*  11.8*  WBC  9.9  9.6  PLT  499*  573*    Recent Labs     02/25/14  0415  02/27/14  0852  NA  140  144  K  2.9*  3.1*  CL  98  102  CO2  32  29  BUN  14  13  CREATININE  0.86  0.82  CALCIUM  8.4  9.0  GFRNONAA  89*  >90  GFRAA  >90  >90   Culture & Susceptibility            Antibiotic   Organism Organism Organism          METHICILLIN RESISTANT STAPHYLOCOCCUS AUREUS          CLINDAMYCIN    <=0.25 SENSITIVE   S Final              ERYTHROMYCIN    <=0.25 SENSITIVE   S Final              GENTAMICIN    <=0.5 SENSITIVE   S Final              LEVOFLOXACIN    <=0.12 SENSITIVE   S Final              OXACILLIN    >=4 RESISTANT   R Final              PENICILLIN    >=0.5 RESISTANT   R Final              RIFAMPIN    <=0.5 SENSITIVE   S Final              TETRACYCLINE    <=1 SENSITIVE   S Final              TRIMETH/SULFA    <=10 SENSITIVE   S Final             VANCOMYCIN    <=0.5 SENSITIVE   S Final                                  Comments            Organism: METHICILLIN RESISTANT STAPHYLOCOCCUS AUREUS Antibiotic: CLINDAMYCIN      Method: MIC  Organism: METHICILLIN RESISTANT STAPHYLOCOCCUS AUREUS Antibiotic: ERYTHROMYCIN      Method: MIC               Organism: METHICILLIN RESISTANT STAPHYLOCOCCUS AUREUS Antibiotic: GENTAMICIN      Method: MIC               Organism: METHICILLIN RESISTANT STAPHYLOCOCCUS AUREUS Antibiotic: LEVOFLOXACIN      Method: MIC               Organism: METHICILLIN RESISTANT STAPHYLOCOCCUS AUREUS Antibiotic: OXACILLIN      Method: MIC               Organism: METHICILLIN RESISTANT STAPHYLOCOCCUS AUREUS Antibiotic: PENICILLIN      Method: MIC               Organism: METHICILLIN RESISTANT STAPHYLOCOCCUS AUREUS Antibiotic: RIFAMPIN      Method: MIC               Organism: METHICILLIN RESISTANT STAPHYLOCOCCUS AUREUS Antibiotic: TETRACYCLINE      Method: MIC               Organism: METHICILLIN RESISTANT STAPHYLOCOCCUS AUREUS Antibiotic: TRIMETH/SULFA      Method: MIC               Organism: METHICILLIN RESISTANT STAPHYLOCOCCUS AUREUS Antibiotic: VANCOMYCIN      Method: MIC               METHICILLIN RESISTANT STAPHYLOCOCCUS AUREUS (MIC):      ABUNDANT METHICILLIN RESISTANT STAPHYLOCOCCUS AUREUS         Specimen Collected: 02/23/14  5:51 PM Last Resulted: 02/26/14  9:59 AM             Assessment/Plan: Community acquired MRSA. Will be d/c's on clindamycin.    Wound care discussed. Will be followed by Dr. Marlou PorchHerrick.

## 2014-03-01 LAB — CULTURE, BLOOD (ROUTINE X 2)
CULTURE: NO GROWTH
Culture: NO GROWTH

## 2014-04-15 ENCOUNTER — Other Ambulatory Visit (HOSPITAL_COMMUNITY): Payer: Self-pay | Admitting: Urology

## 2014-04-15 DIAGNOSIS — L02216 Cutaneous abscess of umbilicus: Secondary | ICD-10-CM

## 2014-04-28 ENCOUNTER — Ambulatory Visit (HOSPITAL_COMMUNITY)
Admission: RE | Admit: 2014-04-28 | Discharge: 2014-04-28 | Disposition: A | Discharge status: Home / Self Care | Payer: Medicare Other | Source: Ambulatory Visit | Attending: Urology | Admitting: Urology

## 2014-04-28 ENCOUNTER — Other Ambulatory Visit (HOSPITAL_COMMUNITY): Payer: Self-pay | Admitting: Interventional Radiology

## 2014-04-28 ENCOUNTER — Ambulatory Visit (HOSPITAL_COMMUNITY)
Admission: RE | Admit: 2014-04-28 | Discharge: 2014-04-28 | Disposition: A | Discharge status: Home / Self Care | Payer: Medicare Other | Source: Ambulatory Visit | Attending: Interventional Radiology | Admitting: Interventional Radiology

## 2014-04-28 DIAGNOSIS — L02216 Cutaneous abscess of umbilicus: Secondary | ICD-10-CM

## 2014-04-28 DIAGNOSIS — K4090 Unilateral inguinal hernia, without obstruction or gangrene, not specified as recurrent: Secondary | ICD-10-CM | POA: Diagnosis not present

## 2014-04-28 DIAGNOSIS — L02211 Cutaneous abscess of abdominal wall: Secondary | ICD-10-CM | POA: Insufficient documentation

## 2014-04-28 DIAGNOSIS — K5790 Diverticulosis of intestine, part unspecified, without perforation or abscess without bleeding: Secondary | ICD-10-CM | POA: Diagnosis not present

## 2016-09-13 IMAGING — CT CT PELVIS W/O CM
1 of 2 series · 14 of 32 positions shown, 19 images · non-contrast
Comparison: 02/22/2014 abdominal CT.

CLINICAL DATA: Evaluate for bladder fissure lobe. Abdominal wall
abscess. Initial encounter.

EXAM:
CT PELVIS WITHOUT CONTRAST
TECHNIQUE: Multidetector CT imaging of the pelvis was performed following the
standard protocol without intravenous contrast.

[Series 2: pelvis with · axial · 0.74mm/px · z∈[+1092,+1362]mm · 14 of 62 slices shown, 19 images]
[im 4/62  soft-tissue]
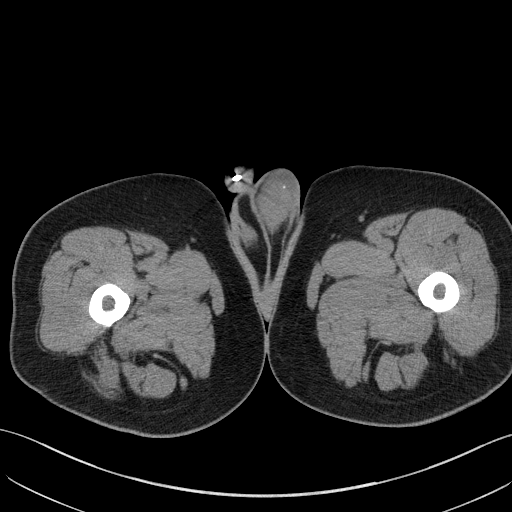
[im 4/62  bone]
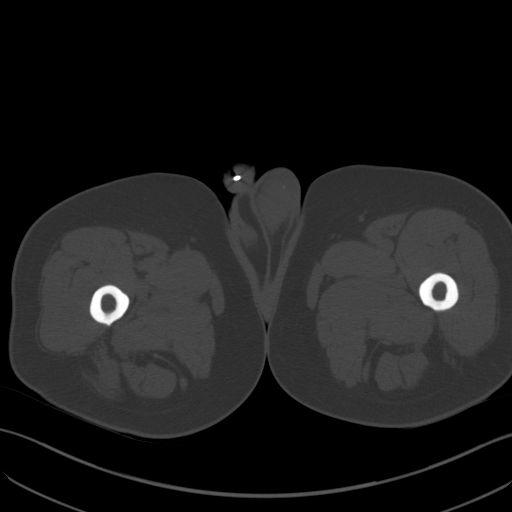
[im 10/62  soft-tissue]
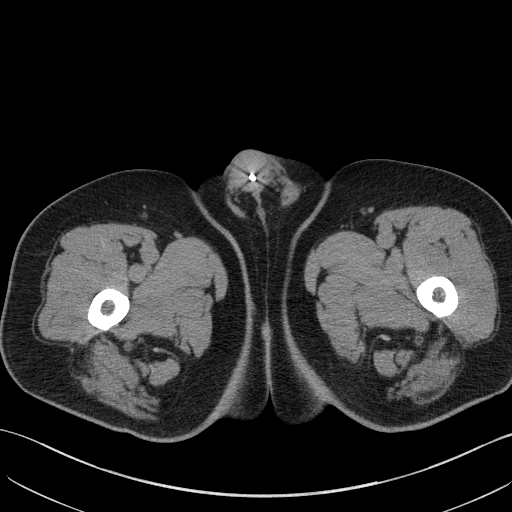
[im 13/62  soft-tissue]
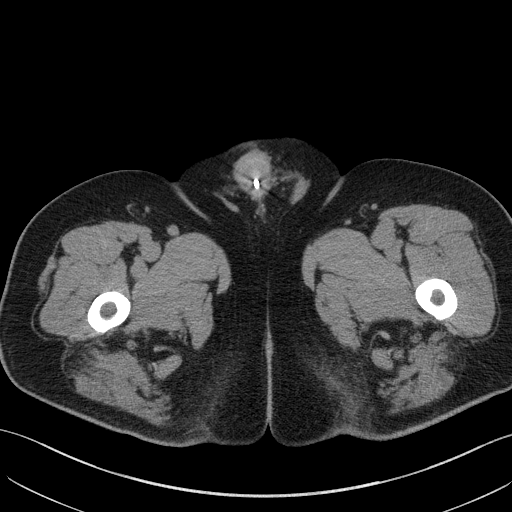
[im 17/62  soft-tissue]
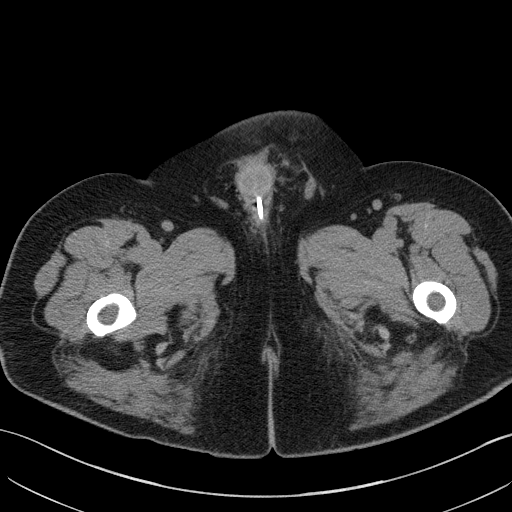
[im 23/62  soft-tissue]
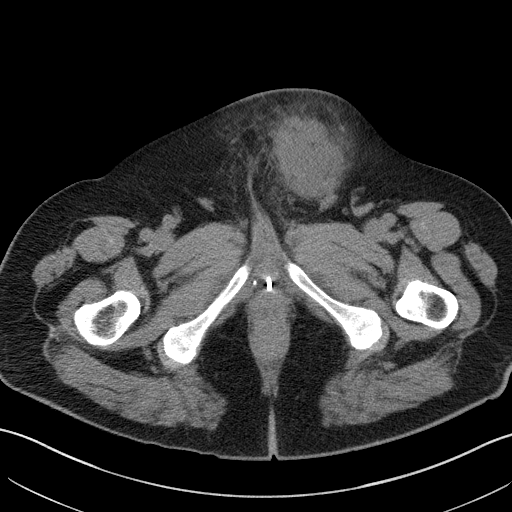
[im 26/62  soft-tissue]
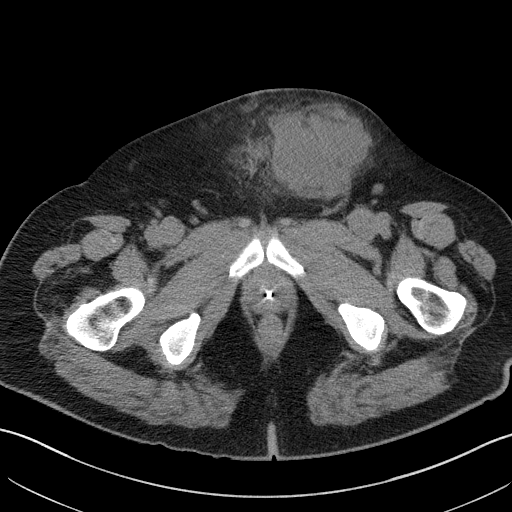
[im 33/62  soft-tissue]
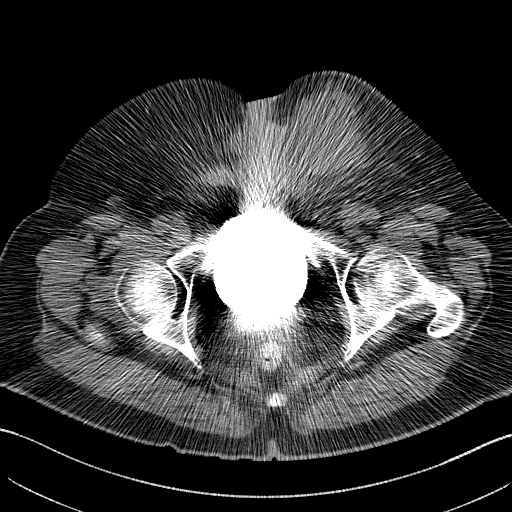
[im 36/62  soft-tissue]
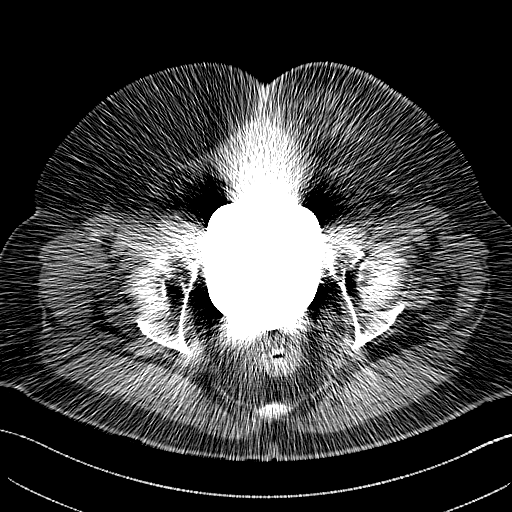
[im 39/62  soft-tissue]
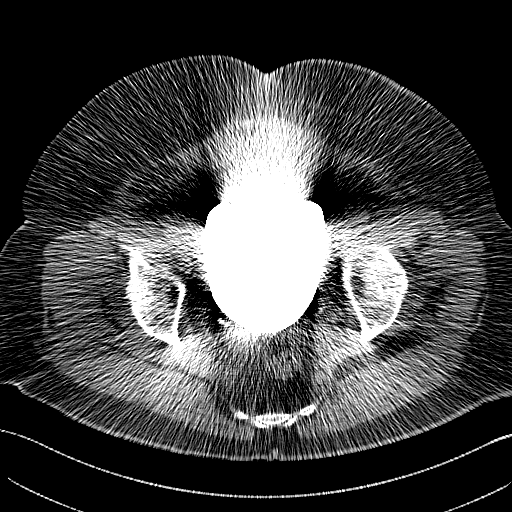
[im 39/62  bone]
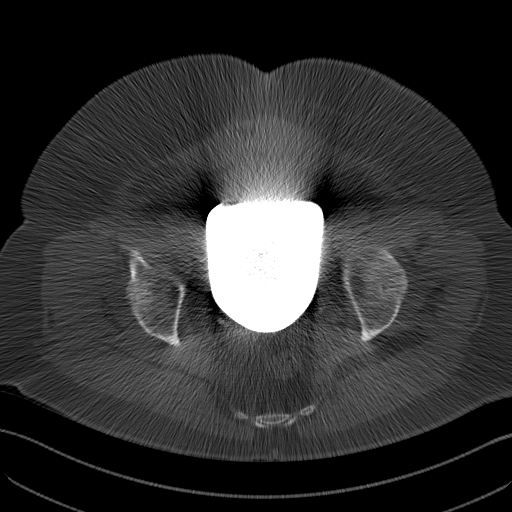
[im 45/62  soft-tissue]
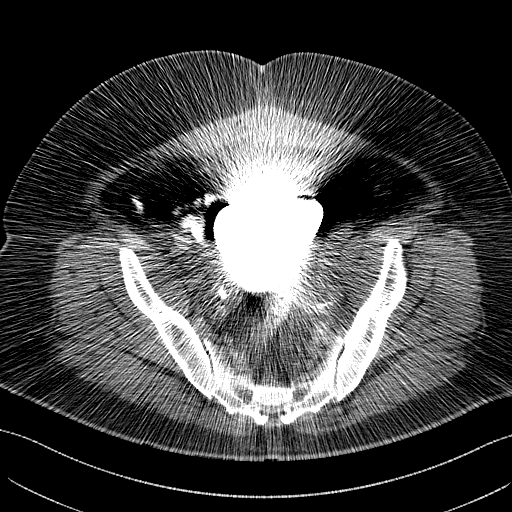
[im 49/62  soft-tissue]
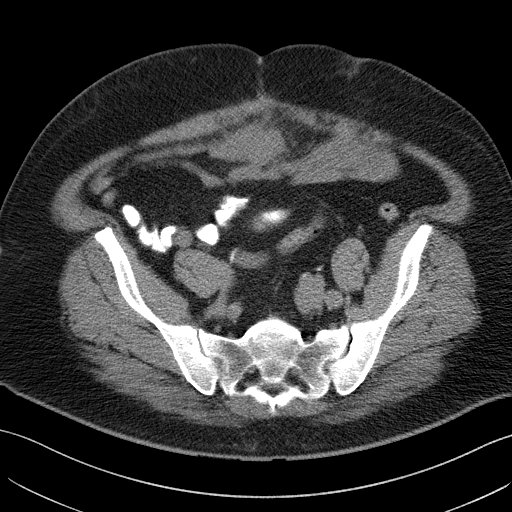
[im 49/62  lung]
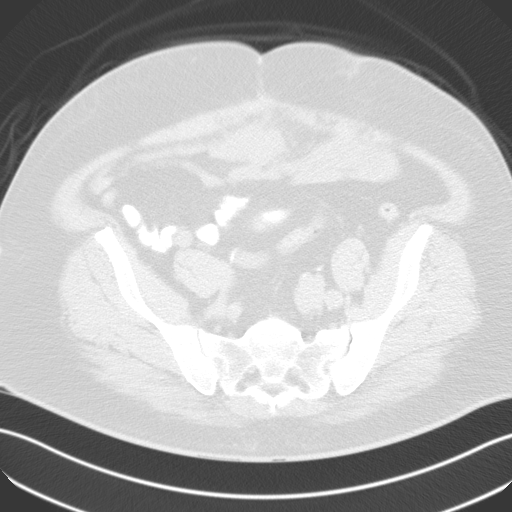
[im 52/62  soft-tissue]
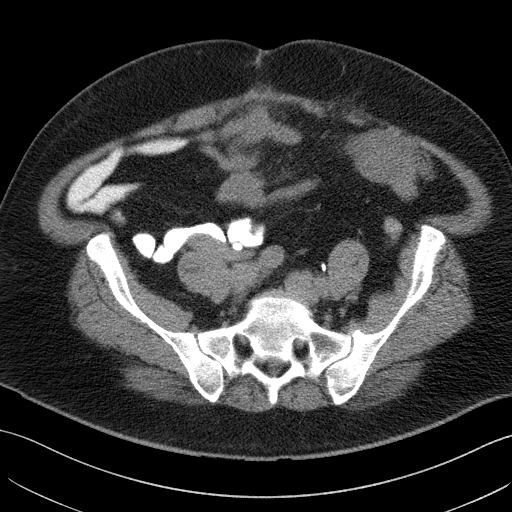
[im 52/62  lung]
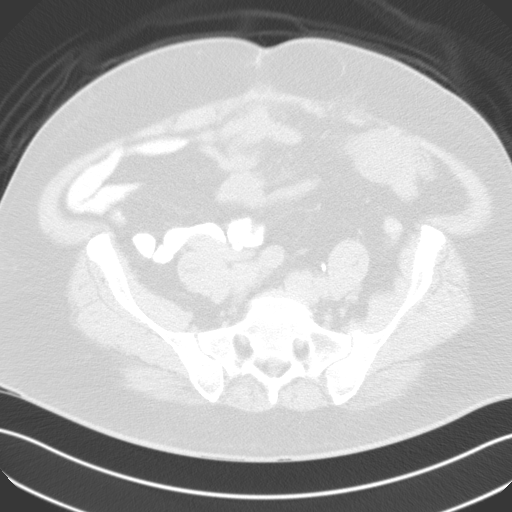
[im 55/62  lung]
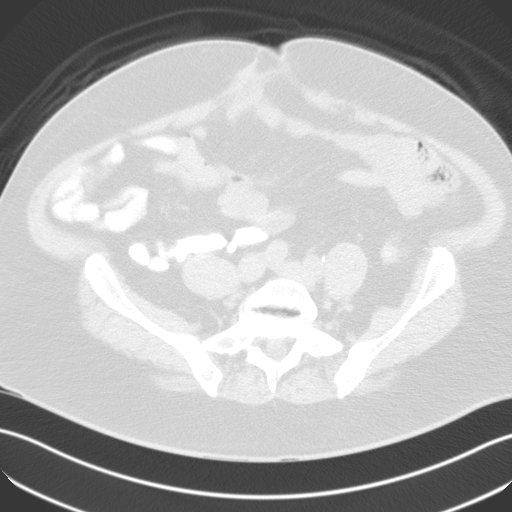
[im 58/62  soft-tissue]
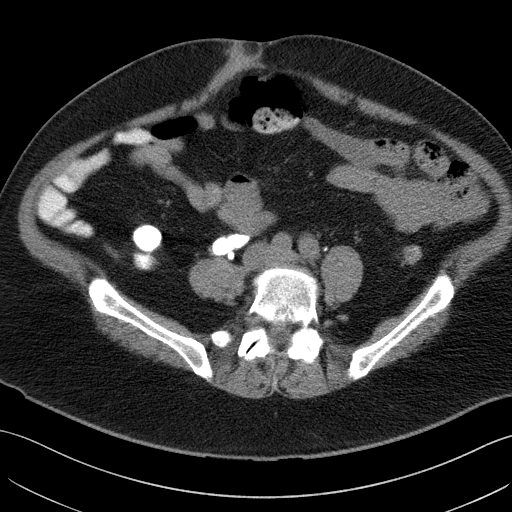
[im 58/62  lung]
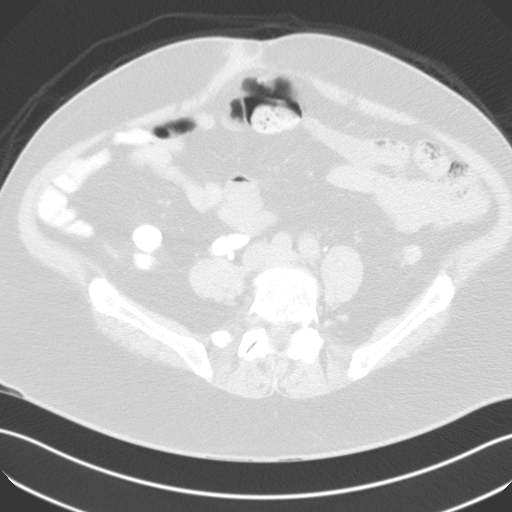

[14 of 32 positions shown; findings below may reference images not displayed]

FINDINGS: 300 cc of iodinated contrast was injected through the patient's
Foley catheter, with adequate distension of the urinary bladder.
There is no contrast noted within the abscess or soft tissue at the
bladder apex. The anterior bladder urothelium may be irregular in
contour, although this could also be artifactual from mixing. There
is no evidence of bowel fistula. Contrast in right lower quadrant
small bowel is likely residual from previously administered oral
contrast. These loops are discrete from the abscess and ventral
bladder on previous imaging.
IMPRESSION: No cystographic evidence of communication between the bladder and
the bowel/suprapubic abscess.

## 2016-11-16 IMAGING — CT CT PELVIS W/O CM
1 of 2 series · 14 of 32 positions shown, 18 images · non-contrast
Comparison: CT 02/23/2014, 02/22/2014

CLINICAL DATA: 63-year-old male with a history of abdominal wall
abscess. The patient received a ultrasound guided drainage catheter
02/23/2014 into the abdominal wall.

There is a question of whether there is residual fluid and a
connection into the abdominal cavity.
EXAM:
CT PELVIS WITHOUT CONTRAST
HAND INJECTION (Dr. Waifong Arugay) OF CONTRAST INTO ANTERIOR
ABDOMINAL WALL COLLECTION FOR THE PURPOSES OF A SINOGRAM.
TECHNIQUE: Multidetector CT imaging of the pelvis was performed following the
standard protocol without intravenous contrast.

[Series 2: rtn pelvis st · axial · 0.90mm/px · z∈[-436,-226]mm · 14 of 48 slices shown, 18 images]
[im 3/48  soft-tissue]
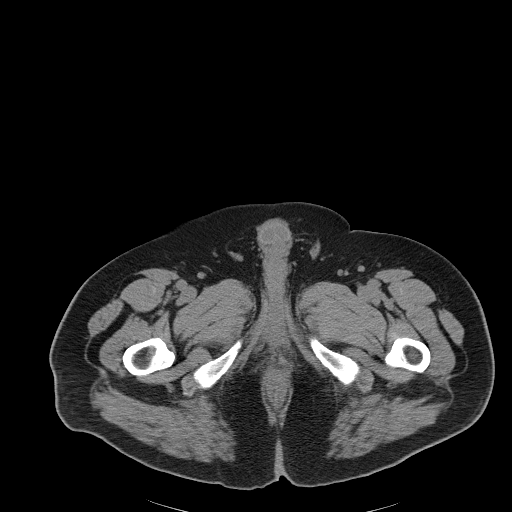
[im 3/48  bone]
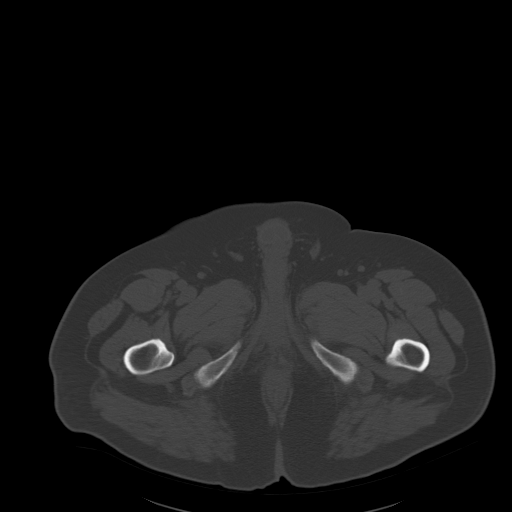
[im 7/48  soft-tissue]
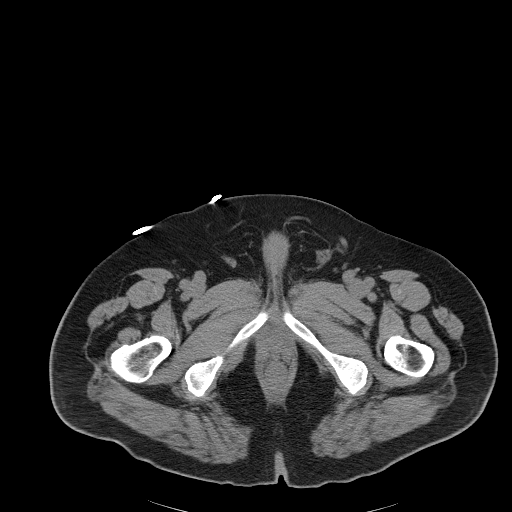
[im 12/48  soft-tissue]
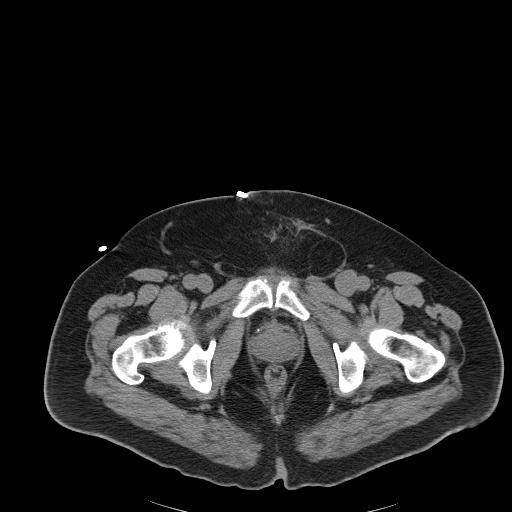
[im 14/48  soft-tissue]
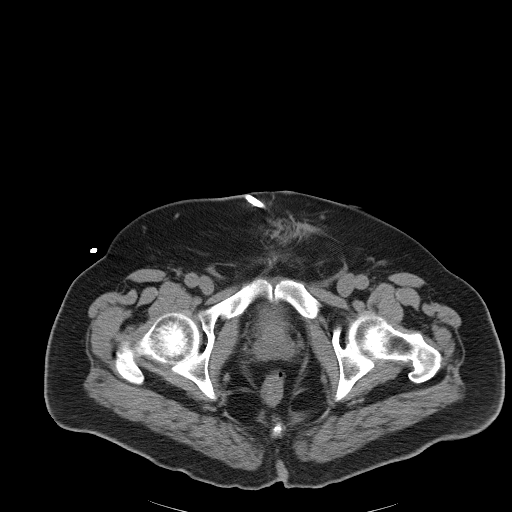
[im 18/48  soft-tissue]
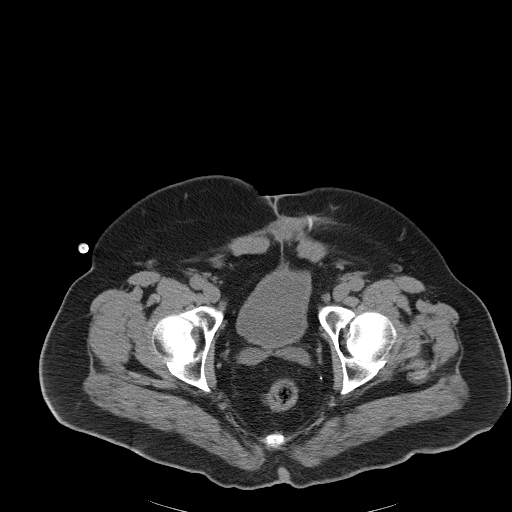
[im 23/48  soft-tissue]
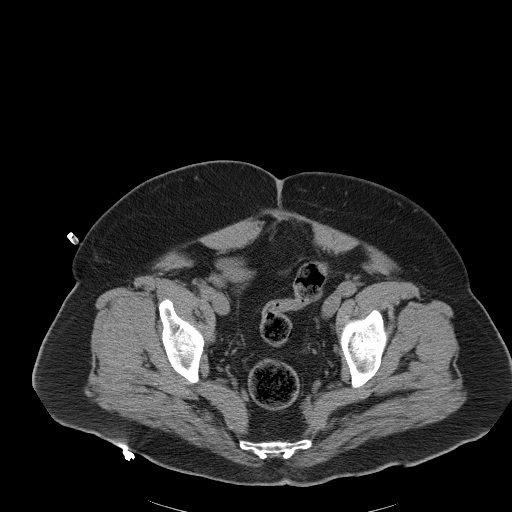
[im 25/48  soft-tissue]
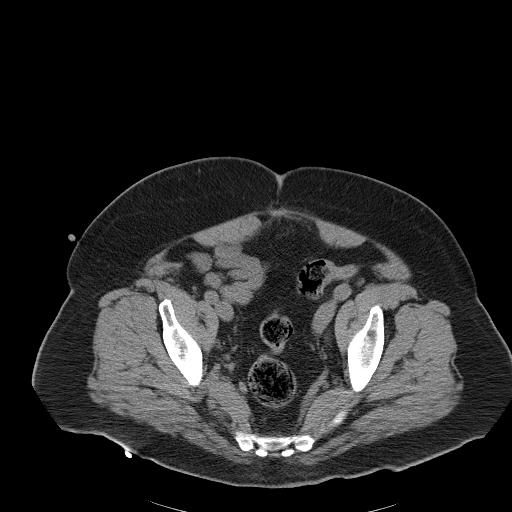
[im 30/48  soft-tissue]
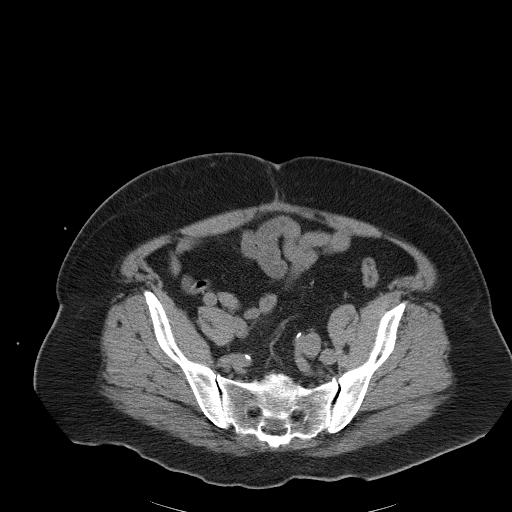
[im 34/48  soft-tissue]
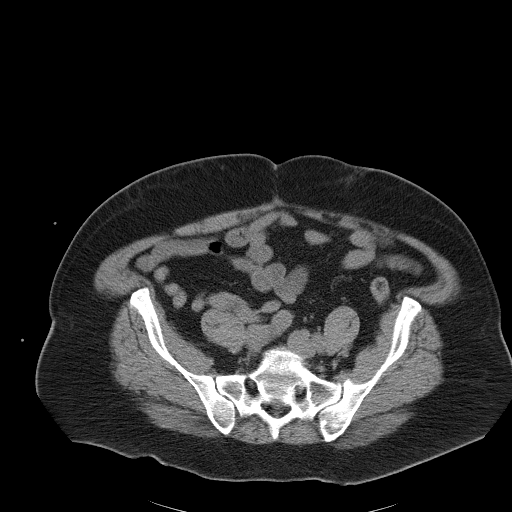
[im 34/48  bone]
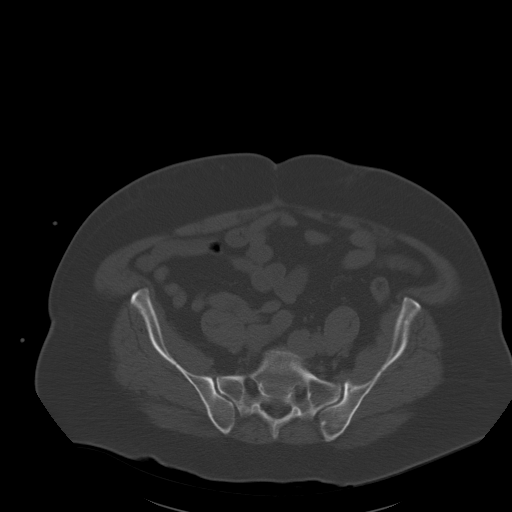
[im 36/48  soft-tissue]
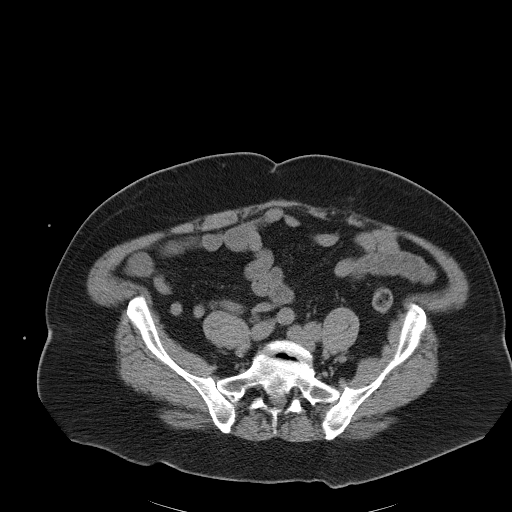
[im 39/48  lung]
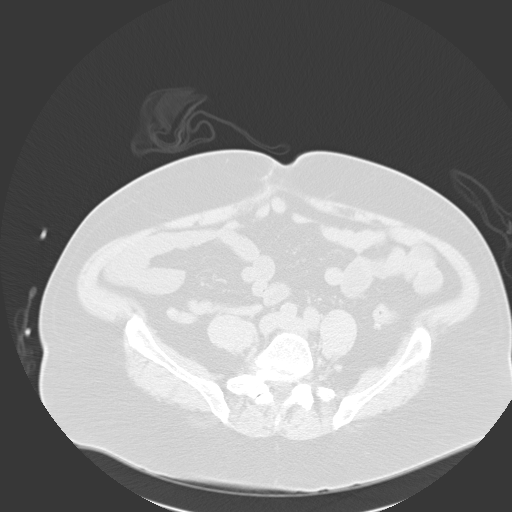
[im 41/48  soft-tissue]
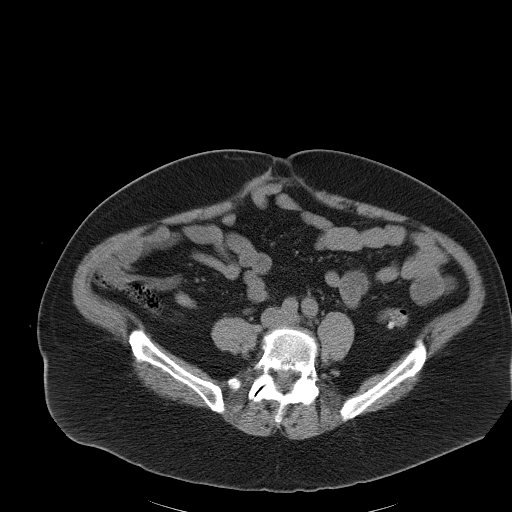
[im 41/48  lung]
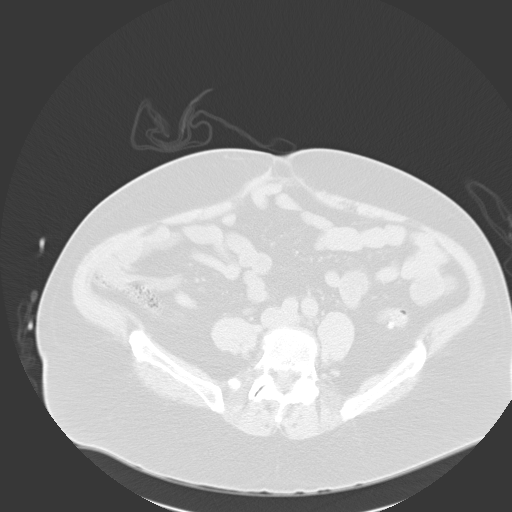
[im 43/48  lung]
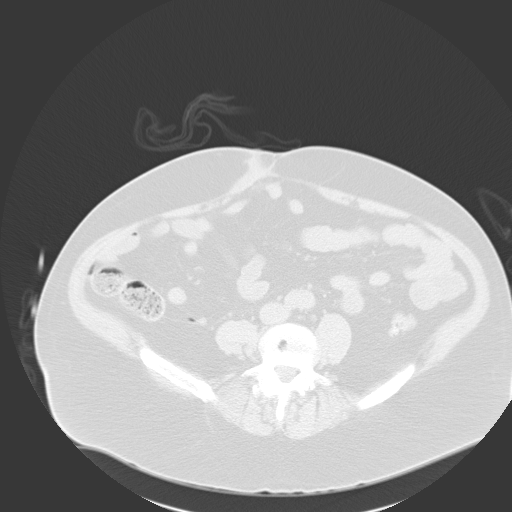
[im 45/48  soft-tissue]
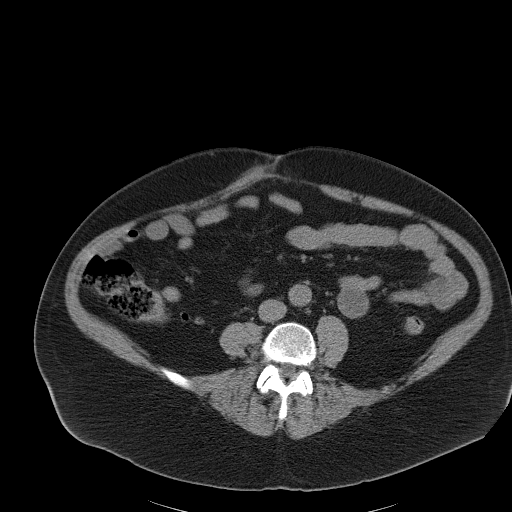
[im 45/48  lung]
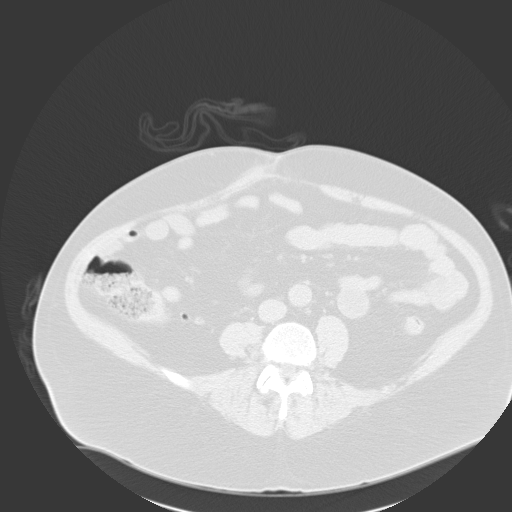

[14 of 32 positions shown; findings below may reference images not displayed]

FINDINGS: Pelvis:

Visualized small bowel unremarkable. Colonic diverticula present
within the sigmoid colon. No associated inflammation of the
visualized segment.

No free intraperitoneal air or significant free fluid. Normal
appendix.

No lymphadenopathy identified.

Unremarkable appearance of the visualized musculature.

Pigtail drainage catheter within the anterior abdominal wall, just
to the left of midline. The catheter enters to the right of the
midline overlying the rectus musculature. No significant fluid
collection surrounding the catheter, which appears much improved
from the comparison CT.

After hand injection of 10 cc of contrast, there is almost no
contrast surrounding the pigtail catheter. During the injection it
appears the majority of contrast exited the space along the course
of the catheter.

The superior left aspect of the urinary bladder is pointed to the
patient's left. There is interval resolution of the inflammatory
changes extending from the superior left aspect of the urinary
bladder to the abdominal wall. No residual fluid collection.

No bladder wall thickening. Unremarkable appearance of the seminal
vesicles and prostate.

Fact containing left inguinal hernia, similar to prior.

Mild degenerative disc disease of the lower lumbar spine. No acute
fracture identified. No aggressive bone lesions. Hyperdense
sclerotic focus in the posterior aspect the left iliac bone,
unchanged from comparison and likely representing a bone island.

Atherosclerotic calcifications.
IMPRESSION: Interval resolution of both the anterior abdominal wall fluid
collection/ inflammation, as well as the pelvic component of
abscess/inflammation extending from the dome of the bladder to the
abdominal wall.

Pigtail catheter in the region of the fluid collection, exiting the
skin to the right of midline. Hand injection of 10 cc of radiopaque
contrast appears to have exited the skin along the course of the
catheter, with no appreciable contrast at the pigtail catheter.
There appears to be collapse of this previous potential space.

Diverticular disease, incompletely imaged. No evidence of associated
inflammatory changes.

Mild atherosclerosis.

Fact containing left inguinal hernia.
